# Patient Record
Sex: Female | Born: 1995 | Race: Black or African American | Hispanic: No | Marital: Single | Smoking: Never smoker
Health system: Southern US, Community
[De-identification: ages and names within clinical notes are randomized; demographics above are authoritative.]

## PROBLEM LIST (undated history)

## (undated) DIAGNOSIS — F32A Depression, unspecified: Secondary | ICD-10-CM

## (undated) DIAGNOSIS — G4730 Sleep apnea, unspecified: Secondary | ICD-10-CM

## (undated) DIAGNOSIS — F329 Major depressive disorder, single episode, unspecified: Secondary | ICD-10-CM

## (undated) DIAGNOSIS — R569 Unspecified convulsions: Secondary | ICD-10-CM

## (undated) DIAGNOSIS — F419 Anxiety disorder, unspecified: Secondary | ICD-10-CM

## (undated) HISTORY — PX: NO PAST SURGERIES: SHX2092

## (undated) HISTORY — PX: OTHER SURGICAL HISTORY: SHX169

---
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
## 2014-01-10 ENCOUNTER — Ambulatory Visit (INDEPENDENT_AMBULATORY_CARE_PROVIDER_SITE_OTHER): Payer: BC Managed Care – PPO | Admitting: Neurology

## 2014-01-10 ENCOUNTER — Encounter: Payer: Self-pay | Admitting: Neurology

## 2014-01-10 ENCOUNTER — Encounter (INDEPENDENT_AMBULATORY_CARE_PROVIDER_SITE_OTHER): Payer: Self-pay

## 2014-01-10 VITALS — BP 97/70 | HR 99 | Ht 65.0 in | Wt 135.0 lb

## 2014-01-10 DIAGNOSIS — R569 Unspecified convulsions: Secondary | ICD-10-CM | POA: Insufficient documentation

## 2014-01-10 MED ORDER — LEVETIRACETAM 500 MG PO TABS: 500.0000 mg | ORAL_TABLET | Freq: Two times a day (BID) | ORAL | Status: DC

## 2014-01-10 NOTE — Progress Notes (Signed)
PATIENT: Sharon [name] DOB: 1995-12-08  HISTORICAL  Sharon [name] is a 18 years old right-handed African American female, referred by her school health care Sharon [name] for evaluation of seizure    She reported a history of seizures since age 73, all episodes have similar presentation, sometime she proceeded by heart beating fast, mild left-sided headaches, followed by convulsion, lost of consciousness, sometimes she workup with tongue biting, initially she has episode couple times each 3 months, but in recent 2 weeks, she had an episode daily, the last episode was yesterday evening, she was sitting on the sofa watching TV, woke up confused, she had bumped her head into furniture. She was treated gabapentin 300 mg every night by her primary care physician,  She reported previous cardiac monitoring, for one month, no significant abnormality found,  REVIEW OF SYSTEMS: Full 14 system review of systems performed and notable only for seizure, slurred speech, headaches, chest pain  ALLERGIES: Allergies  Allergen Reactions  . Latex   . Wheat Bran   . Tape Rash    HOME MEDICATIONS: Current Outpatient Prescriptions on File Prior to Visit  Medication Sig Dispense Refill  . gabapentin (NEURONTIN) 300 MG capsule Take 300 mg by mouth at bedtime.      . triamcinolone cream (KENALOG) 0.1 % Apply 1 application topically 3 (three) times daily as needed (for eczema).       No current facility-administered medications on file prior to visit.    PAST MEDICAL HISTORY: Past Medical History  Diagnosis Date  . Anxiety   . Depression   . Seizure-like activity     PAST SURGICAL HISTORY: Past Surgical History  Procedure Laterality Date  . None      FAMILY HISTORY: Family History  Problem Relation Age of Onset  .       SOCIAL HISTORY:  History   Social History  . Marital Status: Single    Spouse Name: N/A    Number of Children: 0  . Years of Education: college    Occupational History  . STUDENT [employer]   Social History Main Topics  . Smoking status: Never Smoker   . Smokeless tobacco: Never Used  . Alcohol Use: No  . Drug Use: No  . Sexual Activity: Not on file   Other Topics Concern  . Not on file   Social History Narrative   Patient is going to college full time . [employer].    Patient is working full time.   Right handed.   Caffeine None     PHYSICAL EXAM   Filed Vitals:   01/10/14 1548  BP: 97/70  Pulse: 99  Height:  (1.651 m)  Weight: 135 lb (61.236 kg)    Not recorded    Body mass index is 22.47 kg/(m^2).   Generalized: In no acute distress  Neck: Supple, no carotid bruits   Cardiac: Regular rate rhythm  Pulmonary: Clear to auscultation bilaterally  Musculoskeletal: No deformity  Neurological examination  Mentation: Alert oriented to time, place, history taking, and causual conversation  Cranial nerve II-XII: Pupils were equal round reactive to light. Extraocular movements were full.  Visual field were full on confrontational test. Bilateral fundi were sharp.  Facial sensation and strength were normal. Hearing was intact to finger rubbing bilaterally. Uvula tongue midline.  Head turning and shoulder shrug and were normal and symmetric.Tongue protrusion into cheek strength was normal.  Motor: Normal tone, bulk and strength.  Sensory: Intact to  fine touch, pinprick, preserved vibratory sensation, and proprioception at toes.  Coordination: Normal finger to nose, heel-to-shin bilaterally there was no truncal ataxia  Gait: Rising up from seated position without assistance, normal stance, without trunk ataxia, moderate stride, good arm swing, smooth turning, able to perform tiptoe, and heel walking without difficulty.   Romberg signs: Negative  Deep tendon reflexes: Brachioradialis 2/2, biceps 2/2, triceps 2/2, patellar 2/2, Achilles 2/2, plantar responses were flexor bilaterally.   DIAGNOSTIC  DATA (LABS, IMAGING, TESTING) - I reviewed patient records, labs, notes, testing and imaging myself where available.   ASSESSMENT AND PLAN  Sharon [name] is a 18 y.o. female presenting with recurrent seizure, consistent with generalized tonic-clonic seizure  1. Complete evaluation with MRI of the brain with and without contrast, EEG 2. No driving until seizure free for 6 months, 3. Keppra 500 mg twice a day 4. Return to clinic in one to 2 months.     Levert Feinstein, M.D. Ph.D.  [facility] [address], [address] [place], [place] [address] [phone]

## 2014-01-17 ENCOUNTER — Other Ambulatory Visit: Payer: BC Managed Care – PPO

## 2014-01-21 ENCOUNTER — Other Ambulatory Visit: Payer: BC Managed Care – PPO | Admitting: Radiology

## 2014-02-11 ENCOUNTER — Ambulatory Visit: Payer: BC Managed Care – PPO | Admitting: Neurology

## 2014-02-26 ENCOUNTER — Encounter (HOSPITAL_COMMUNITY): Payer: Self-pay | Admitting: Emergency Medicine

## 2014-02-26 ENCOUNTER — Emergency Department (HOSPITAL_COMMUNITY)
Admission: EM | Admit: 2014-02-26 | Discharge: 2014-02-26 | Disposition: A | Discharge status: Home / Self Care | Payer: BC Managed Care – PPO | Attending: Emergency Medicine | Admitting: Emergency Medicine

## 2014-02-26 DIAGNOSIS — Z91040 Latex allergy status: Secondary | ICD-10-CM | POA: Diagnosis not present

## 2014-02-26 DIAGNOSIS — F329 Major depressive disorder, single episode, unspecified: Secondary | ICD-10-CM | POA: Insufficient documentation

## 2014-02-26 DIAGNOSIS — N390 Urinary tract infection, site not specified: Secondary | ICD-10-CM | POA: Insufficient documentation

## 2014-02-26 DIAGNOSIS — R569 Unspecified convulsions: Secondary | ICD-10-CM | POA: Diagnosis present

## 2014-02-26 DIAGNOSIS — Z3202 Encounter for pregnancy test, result negative: Secondary | ICD-10-CM | POA: Insufficient documentation

## 2014-02-26 DIAGNOSIS — F419 Anxiety disorder, unspecified: Secondary | ICD-10-CM | POA: Diagnosis not present

## 2014-02-26 DIAGNOSIS — G40909 Epilepsy, unspecified, not intractable, without status epilepticus: Secondary | ICD-10-CM | POA: Insufficient documentation

## 2014-02-26 DIAGNOSIS — Z79899 Other long term (current) drug therapy: Secondary | ICD-10-CM | POA: Diagnosis not present

## 2014-02-26 LAB — URINE MICROSCOPIC-ADD ON

## 2014-02-26 LAB — CBG MONITORING, ED: GLUCOSE-CAPILLARY: 91 mg/dL (ref 70–99)

## 2014-02-26 LAB — URINALYSIS, ROUTINE W REFLEX MICROSCOPIC
BILIRUBIN URINE: NEGATIVE
GLUCOSE, UA: NEGATIVE mg/dL
Ketones, ur: NEGATIVE mg/dL
Nitrite: POSITIVE — AB
Protein, ur: NEGATIVE mg/dL
Specific Gravity, Urine: 1.026 (ref 1.005–1.030)
UROBILINOGEN UA: 1 mg/dL (ref 0.0–1.0)
pH: 6 (ref 5.0–8.0)

## 2014-02-26 LAB — POC URINE PREG, ED: PREG TEST UR: NEGATIVE

## 2014-02-26 MED ORDER — CEFPODOXIME PROXETIL 100 MG PO TABS: 100.0000 mg | ORAL_TABLET | Freq: Two times a day (BID) | ORAL | Status: DC

## 2014-02-26 MED ORDER — LEVETIRACETAM IN NACL 1000 MG/100ML IV SOLN
1000.0000 mg | Freq: Once | INTRAVENOUS | Status: AC
  Administered 2014-02-26: 1000 mg via INTRAVENOUS
  Filled 2014-02-26: qty 100

## 2014-02-26 NOTE — ED Provider Notes (Signed)
CSN: [id number]     Arrival date & time 02/26/14  1048 History   First MD Initiated Contact with Patient 02/26/14 1057     Chief Complaint  Patient presents with  . 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 Per EMS she had no postictal phase. She was given 5 mg of IV Versed in route. She states she has had seizures between 5 and 10 years. She states she has an aura associated with seizures and remembers having an aura today. She has had no tongue biting, no bowel or bladder incontinence. She reports having recently improving URI symptoms as well as recent UTI which has not improved despite antibiotic treatment. She sleeps about 4 hours nightly. She reports being compliant with her Keppra which she has been on for one week but has not taken it today.  Patient is a 18 y.o. female presenting with seizures.  Seizures   Past Medical History  Diagnosis Date  . Anxiety   . Depression   . Seizure-like activity    Past Surgical History  Procedure Laterality Date  . None     Family History  Problem Relation Age of Onset  .      History  Substance Use Topics  . Smoking status: Never Smoker   . Smokeless tobacco: Never Used  . Alcohol Use: No   OB History    No data available     Review of Systems  Constitutional: Negative for fever, chills, diaphoresis, activity change, appetite change and fatigue.  HENT: Negative for congestion, facial swelling, rhinorrhea and sore throat.   Eyes: Negative for photophobia and discharge.  Respiratory: Negative for cough, chest tightness and shortness of breath.   Cardiovascular: Negative for chest pain, palpitations and leg swelling.  Gastrointestinal: Negative for nausea, vomiting, abdominal pain and diarrhea.  Endocrine: Negative for polydipsia and  polyuria.  Genitourinary: Negative for dysuria, frequency, difficulty urinating and pelvic pain.  Musculoskeletal: Negative for back pain, arthralgias, neck pain and neck stiffness.  Skin: Negative for color change and wound.  Allergic/Immunologic: Negative for immunocompromised state.  Neurological: Positive for seizures. Negative for facial asymmetry, weakness, numbness and headaches.  Hematological: Does not bruise/bleed easily.  Psychiatric/Behavioral: Negative for confusion and agitation.      Allergies  Latex; Wheat bran; and Tape  Home Medications   Prior to Admission medications   Medication Sig Start Date End Date Taking? Authorizing Provider  levETIRAcetam (KEPPRA) 500 MG tablet Take 1 tablet (500 mg total) by mouth 2 (two) times daily. 01/10/14  Yes Levert FeinsteinYijun Yan, MD  levonorgestrel-ethinyl estradiol (SEASONALE,INTROVALE,JOLESSA) 0.15-0.03 MG tablet Take 1 tablet by mouth daily.   Yes Historical Provider, MD  triamcinolone cream (KENALOG) 0.1 % Apply 1 application topically 3 (three) times daily as needed (for eczema).   Yes Historical Provider, MD  cefpodoxime (VANTIN) 100 MG tablet Take 1 tablet (100 mg total) by mouth 2 (two) times daily. 02/26/14   Toy CookeyMegan Docherty, MD  gabapentin (NEURONTIN) 300 MG capsule Take 300 mg by mouth at bedtime.    Historical Provider, MD   BP 110/73 mmHg  Pulse 84  Temp(Src) 98.2 F (36.8 C) (Oral)  Resp 21  SpO2 100%  LMP 02/07/2014 Physical Exam  Constitutional: She is oriented to person, place, and time. She appears well-developed and well-nourished. No distress.  HENT:  Head: Normocephalic and atraumatic.  Mouth/Throat:  No oropharyngeal exudate.  Eyes: Pupils are equal, round, and reactive to light.  Neck: Normal range of motion. Neck supple.  Cardiovascular: Normal rate, regular rhythm and normal heart sounds.  Exam reveals no gallop and no friction rub.   No murmur heard. Pulmonary/Chest: Effort normal and breath sounds normal. No  respiratory distress. She has no wheezes. She has no rales.  Abdominal: Soft. Bowel sounds are normal. She exhibits no distension and no mass. There is no tenderness. There is no rebound and no guarding.  Musculoskeletal: Normal range of motion. She exhibits no edema or tenderness.  Neurological: She is alert and oriented to person, place, and time. She has normal strength. She displays no tremor. No cranial nerve deficit or sensory deficit. She exhibits normal muscle tone. She displays a negative Romberg sign. Coordination normal. GCS eye subscore is 4. GCS verbal subscore is 5. GCS motor subscore is 6.  Skin: Skin is warm and dry.  Psychiatric: She has a normal mood and affect.    ED Course  Procedures (including critical care time) Labs Review Labs Reviewed  URINALYSIS, ROUTINE W REFLEX MICROSCOPIC - Abnormal; Notable for the following:    APPearance CLOUDY (*)    Hgb urine dipstick TRACE (*)    Nitrite POSITIVE (*)    Leukocytes, UA SMALL (*)    All other components within normal limits  URINE MICROSCOPIC-ADD ON - Abnormal; Notable for the following:    Bacteria, UA MANY (*)    All other components within normal limits  POC URINE PREG, ED  CBG MONITORING, ED    Imaging Review No results found.   EKG Interpretation   Date/Time:  Tuesday February 26 2014 10:55:50 EST Ventricular Rate:  93 PR Interval:  130 QRS Duration: 84 QT Interval:  342 QTC Calculation: 425 R Axis:   101 Text Interpretation:  Sinus rhythm Borderline right axis deviation No  prior for comparison Confirmed by DOCHERTY  MD, MEGAN ([id number]) on 02/26/2014  11:01:18 AM      MDM   Final diagnoses:  Seizures  UTI (lower urinary tract infection)    Pt is a 18 y.o. female with Pmhx as above who presents with one witnessed seizure. Per family. Per EMS patient had no postictal phase, she received 5 mg of IV Versed in route. On my exam she is awake alert and in no acute distress. She is no focal neuro  findings. She reports she saw Dr. Vanessa KickJan in September who placed her on Keppra which she started 1 week ago and also recommended an MRI and EEG which she has chosen to not do given cost ( of no she does have insurance). She reports having a recent URI which she feels is improving as well as recent UTI which she feels is not improving despite 3 days of Bactrim and 3 days of an unknown other antibiotic. She also reports she gets only about 4 hours of sleep at night. I will give an IV load of Keppra in the department and recheck UA. I have encouraged her to follow up with Dr. Terrace ArabiaYan even if she does not follow through with EEG and MRI.  UA appears infected. Will place on 1 week Vantin.        Toy CookeyMegan Docherty, MD 02/26/14 1630

## 2014-02-26 NOTE — ED Notes (Signed)
Pt unable to void 

## 2014-02-26 NOTE — ED Notes (Signed)
Bed: WA17 Expected date:  Expected time:  Means of arrival:  Comments: Ems seizure 

## 2014-02-26 NOTE — ED Notes (Signed)
CBG 91 performed in ED

## 2014-02-26 NOTE — ED Notes (Addendum)
Per EMS pt is [employer] Hx of seizures. Pt had 1 witnessed seizure per faculty. Per EMS pt had no postictal phase. Pt received total of 5 mg versed IVP. Pt is alert and oriented x4 at present. Per EMS CBG 107

## 2014-02-26 NOTE — Discharge Instructions (Signed)
Urinary Tract Infection °Urinary tract infections (UTIs) can develop anywhere along your urinary tract. Your urinary tract is your body's drainage system for removing wastes and extra water. Your urinary tract includes two kidneys, two ureters, a bladder, and a urethra. Your kidneys are a pair of bean-shaped organs. Each kidney is about the size of your fist. They are located below your ribs, one on each side of your spine. °CAUSES °Infections are caused by microbes, which are microscopic organisms, including fungi, viruses, and bacteria. These organisms are so small that they can only be seen through a microscope. Bacteria are the microbes that most commonly cause UTIs. °SYMPTOMS  °Symptoms of UTIs may vary by age and gender of the patient and by the location of the infection. Symptoms in young women typically include a frequent and intense urge to urinate and a painful, burning feeling in the bladder or urethra during urination. Older women and men are more likely to be tired, shaky, and weak and have muscle aches and abdominal pain. A fever may mean the infection is in your kidneys. Other symptoms of a kidney infection include pain in your back or sides below the ribs, nausea, and vomiting. °DIAGNOSIS °To diagnose a UTI, your caregiver will ask you about your symptoms. Your caregiver also will ask to provide a urine sample. The urine sample will be tested for bacteria and white blood cells. White blood cells are made by your body to help fight infection. °TREATMENT  °Typically, UTIs can be treated with medication. Because most UTIs are caused by a bacterial infection, they usually can be treated with the use of antibiotics. The choice of antibiotic and length of treatment depend on your symptoms and the type of bacteria causing your infection. °HOME CARE INSTRUCTIONS °· If you were prescribed antibiotics, take them exactly as your caregiver instructs you. Finish the medication even if you feel better after you  have only taken some of the medication. °· Drink enough water and fluids to keep your urine clear or pale yellow. °· Avoid caffeine, tea, and carbonated beverages. They tend to irritate your bladder. °· Empty your bladder often. Avoid holding urine for long periods of time. °· Empty your bladder before and after sexual intercourse. °· After a bowel movement, women should cleanse from front to back. Use each tissue only once. °SEEK MEDICAL CARE IF:  °· You have back pain. °· You develop a fever. °· Your symptoms do not begin to resolve within 3 days. °SEEK IMMEDIATE MEDICAL CARE IF:  °· You have severe back pain or lower abdominal pain. °· You develop chills. °· You have nausea or vomiting. °· You have continued burning or discomfort with urination. °MAKE SURE YOU:  °· Understand these instructions. °· Will watch your condition. °· Will get help right away if you are not doing well or get worse. °Document Released: 01/13/2005 Document Revised: 10/05/2011 Document Reviewed: 05/14/2011 °ExitCare® Patient Information ©2015 [employer]. This information is not intended to replace advice given to you by your health care provider. Make sure you discuss any questions you have with your health care provider. ° °Epilepsy °Epilepsy is a disorder in which a person has repeated seizures over time. A seizure is a release of abnormal electrical activity in the brain. Seizures can cause a change in attention, behavior, or the ability to remain awake and alert (altered mental status). Seizures often involve uncontrollable shaking (convulsions).  °Most people with epilepsy lead normal lives. However, people with epilepsy are at an   increased risk of falls, accidents, and injuries. Therefore, it is important to begin treatment right away. °CAUSES  °Epilepsy has many possible causes. Anything that disturbs the normal pattern of brain cell activity can lead to seizures. This may include:  °· Head injury. °· Birth trauma. °· High fever  as a child. °· Stroke. °· Bleeding into or around the brain. °· Certain drugs. °· Prolonged low oxygen, such as what occurs after CPR efforts. °· Abnormal brain development. °· Certain illnesses, such as meningitis, encephalitis (brain infection), malaria, and other infections. °· An imbalance of nerve signaling chemicals (neurotransmitters).   °SIGNS AND SYMPTOMS  °The symptoms of a seizure can vary greatly from one person to another. Right before a seizure, you may have a warning (aura) that a seizure is about to occur. An aura may include the following symptoms: °· Fear or anxiety. °· Nausea. °· Feeling like the room is spinning (vertigo). °· Vision changes, such as seeing flashing lights or spots. °Common symptoms during a seizure include: °· Abnormal sensations, such as an abnormal smell or a bitter taste in the mouth.   °· Sudden, general body stiffness.   °· Convulsions that involve rhythmic jerking of the face, arm, or leg on one or both sides.   °· Sudden change in consciousness.   °¨ Appearing to be awake but not responding.   °¨ Appearing to be asleep but cannot be awakened.   °· Grimacing, chewing, lip smacking, drooling, tongue biting, or loss of bowel or bladder control. °After a seizure, you may feel sleepy for a while.  °DIAGNOSIS  °Your health care provider will ask about your symptoms and take a medical history. Descriptions from any witnesses to your seizures will be very helpful in the diagnosis. A physical exam, including a detailed neurological exam, is necessary. Various tests may be done, such as:  °· An electroencephalogram (EEG). This is a painless test of your brain waves. In this test, a diagram is created of your brain waves. These diagrams can be interpreted by a specialist. °· An MRI of the brain.   °· A CT scan of the brain.   °· A spinal tap (lumbar puncture, LP). °· Blood tests to check for signs of infection or abnormal blood chemistry. °TREATMENT  °There is no cure for epilepsy,  but it is generally treatable. Once epilepsy is diagnosed, it is important to begin treatment as soon as possible. For most people with epilepsy, seizures can be controlled with medicines. The following may also be used: °· A pacemaker for the brain (vagus nerve stimulator) can be used for people with seizures that are not well controlled by medicine. °· Surgery on the brain. °For some people, epilepsy eventually goes away. °HOME CARE INSTRUCTIONS  °· Follow your health care provider's recommendations on driving and safety in normal activities. °· Get enough rest. Lack of sleep can cause seizures. °· Only take over-the-counter or prescription medicines as directed by your health care provider. Take any prescribed medicine exactly as directed. °· Avoid any known triggers of your seizures. °· Keep a seizure diary. Record what you recall about any seizure, especially any possible trigger.   °· Make sure the people you live and work with know that you are prone to seizures. They should receive instructions on how to help you. In general, a witness to a seizure should:   °¨ Cushion your head and body.   °¨ Turn you on your side.   °¨ Avoid unnecessarily restraining you.   °¨ Not place anything inside your mouth.   °¨ Call for emergency   medical help if there is any question about what has occurred.   °· Follow up with your health care provider as directed. You may need regular blood tests to monitor the levels of your medicine.   °SEEK MEDICAL CARE IF:  °· You develop signs of infection or other illness. This might increase the risk of a seizure.   °· You seem to be having more frequent seizures.   °· Your seizure pattern is changing.   °SEEK IMMEDIATE MEDICAL CARE IF:  °· You have a seizure that does not stop after a few moments.   °· You have a seizure that causes any difficulty in breathing.   °· You have a seizure that results in a very severe headache.   °· You have a seizure that leaves you with the inability to  speak or use a part of your body.   °Document Released: 04/05/2005 Document Revised: 01/24/2013 Document Reviewed: 11/15/2012 °ExitCare® Patient Information ©2015 [employer]. This information is not intended to replace advice given to you by your health care provider. Make sure you discuss any questions you have with your health care provider. ° °

## 2014-02-28 ENCOUNTER — Encounter (HOSPITAL_COMMUNITY): Payer: Self-pay | Admitting: Emergency Medicine

## 2014-02-28 ENCOUNTER — Emergency Department (HOSPITAL_COMMUNITY): Payer: BC Managed Care – PPO

## 2014-02-28 ENCOUNTER — Observation Stay (HOSPITAL_COMMUNITY): Payer: BC Managed Care – PPO

## 2014-02-28 ENCOUNTER — Observation Stay (HOSPITAL_COMMUNITY)
Admission: EM | Admit: 2014-02-28 | Discharge: 2014-03-01 | Disposition: A | Discharge status: Home / Self Care | Payer: BC Managed Care – PPO | Attending: Internal Medicine | Admitting: Internal Medicine

## 2014-02-28 DIAGNOSIS — Z91040 Latex allergy status: Secondary | ICD-10-CM | POA: Insufficient documentation

## 2014-02-28 DIAGNOSIS — R569 Unspecified convulsions: Secondary | ICD-10-CM

## 2014-02-28 DIAGNOSIS — F419 Anxiety disorder, unspecified: Secondary | ICD-10-CM | POA: Diagnosis present

## 2014-02-28 DIAGNOSIS — G40909 Epilepsy, unspecified, not intractable, without status epilepticus: Principal | ICD-10-CM | POA: Insufficient documentation

## 2014-02-28 DIAGNOSIS — G4730 Sleep apnea, unspecified: Secondary | ICD-10-CM | POA: Diagnosis not present

## 2014-02-28 DIAGNOSIS — Z91018 Allergy to other foods: Secondary | ICD-10-CM | POA: Insufficient documentation

## 2014-02-28 DIAGNOSIS — F329 Major depressive disorder, single episode, unspecified: Secondary | ICD-10-CM | POA: Diagnosis not present

## 2014-02-28 DIAGNOSIS — Z79899 Other long term (current) drug therapy: Secondary | ICD-10-CM | POA: Diagnosis not present

## 2014-02-28 DIAGNOSIS — F32A Depression, unspecified: Secondary | ICD-10-CM | POA: Diagnosis present

## 2014-02-28 HISTORY — DX: Sleep apnea, unspecified: G47.30

## 2014-02-28 LAB — BASIC METABOLIC PANEL
Anion gap: 12 (ref 5–15)
BUN: 11 mg/dL (ref 6–23)
CHLORIDE: 102 meq/L (ref 96–112)
CO2: 22 mEq/L (ref 19–32)
Calcium: 9.3 mg/dL (ref 8.4–10.5)
Creatinine, Ser: 0.72 mg/dL (ref 0.50–1.10)
GFR calc Af Amer: >90 mL/min (ref >90)
GFR calc non Af Amer: >90 mL/min (ref >90)
Glucose, Bld: 101 mg/dL — ABNORMAL HIGH (ref 70–99)
Potassium: 4 mEq/L (ref 3.7–5.3)
SODIUM: 136 meq/L — AB (ref 137–147)

## 2014-02-28 LAB — RAPID URINE DRUG SCREEN, HOSP PERFORMED
Amphetamines: NOT DETECTED
Barbiturates: NOT DETECTED
Benzodiazepines: POSITIVE — AB
COCAINE: NOT DETECTED
OPIATES: NOT DETECTED
TETRAHYDROCANNABINOL: NOT DETECTED

## 2014-02-28 LAB — CBC
HCT: 38.3 % (ref 36.0–46.0)
Hemoglobin: 12 g/dL (ref 12.0–15.0)
MCH: 25.2 pg — ABNORMAL LOW (ref 26.0–34.0)
MCHC: 31.3 g/dL (ref 30.0–36.0)
MCV: 80.5 fL (ref 78.0–100.0)
PLATELETS: 273 10*3/uL (ref 150–400)
RBC: 4.76 MIL/uL (ref 3.87–5.11)
RDW: 13.5 % (ref 11.5–15.5)
WBC: 6.6 10*3/uL (ref 4.0–10.5)

## 2014-02-28 LAB — CBG MONITORING, ED
Glucose-Capillary: 78 mg/dL (ref 70–99)
Glucose-Capillary: 86 mg/dL (ref 70–99)

## 2014-02-28 LAB — PREGNANCY, URINE: Preg Test, Ur: NEGATIVE

## 2014-02-28 MED ORDER — LEVETIRACETAM IN NACL 1000 MG/100ML IV SOLN
1000.0000 mg | Freq: Once | INTRAVENOUS | Status: AC
  Administered 2014-02-28: 1000 mg via INTRAVENOUS
  Filled 2014-02-28: qty 100

## 2014-02-28 MED ORDER — HEPARIN SODIUM (PORCINE) 5000 UNIT/ML IJ SOLN
5000.0000 [IU] | Freq: Three times a day (TID) | INTRAMUSCULAR | Status: DC
  Administered 2014-02-28 – 2014-03-01 (×2): 5000 [IU] via SUBCUTANEOUS
  Filled 2014-02-28 (×5): qty 1

## 2014-02-28 MED ORDER — LEVETIRACETAM 750 MG PO TABS
750.0000 mg | ORAL_TABLET | Freq: Two times a day (BID) | ORAL | Status: DC
  Administered 2014-02-28 – 2014-03-01 (×2): 750 mg via ORAL
  Filled 2014-02-28 (×3): qty 1

## 2014-02-28 MED ORDER — DEXTROSE 5 % IV SOLN
1.0000 g | INTRAVENOUS | Status: DC
  Administered 2014-02-28: 1 g via INTRAVENOUS
  Filled 2014-02-28 (×2): qty 10

## 2014-02-28 MED ORDER — GADOBENATE DIMEGLUMINE 529 MG/ML IV SOLN
12.0000 mL | Freq: Once | INTRAVENOUS | Status: AC | PRN
  Administered 2014-02-28: 12 mL via INTRAVENOUS

## 2014-02-28 MED ORDER — SODIUM CHLORIDE 0.9 % IJ SOLN
3.0000 mL | Freq: Two times a day (BID) | INTRAMUSCULAR | Status: DC
  Administered 2014-02-28 – 2014-03-01 (×2): 3 mL via INTRAVENOUS

## 2014-02-28 MED ORDER — LEVONORGEST-ETH ESTRAD 91-DAY 0.15-0.03 MG PO TABS: 1.0000 | ORAL_TABLET | Freq: Every day | ORAL | Status: DC

## 2014-02-28 NOTE — ED Provider Notes (Signed)
CSN: [id number]     Arrival date & time 02/28/14  1431 History   First MD Initiated Contact with Patient 02/28/14 1512     Chief Complaint  Patient presents with  . Seizures     HPI  Reason for evaluation possible seizure. States she had seizures since she was in high school. States she was originally diagnosed as "syncope". Been on Neurontin for several years. Seen by you for neurology several weeks ago. Change from Neurontin, to Keppra 500 twice a day. Had 3 episodes today where she feels she may have had seizure. First she was at home alone. Awakened feeling "disoriented and confused. Went to healthcare Center was referred here. Had another episode en route with paramedics. Brief postictal period. However, was able to use her phone before arrival to the hospital.  Has never had evaluation CNS imaging, MRI, or EEG. No history of CNS infections or injuries.  Past Medical History  Diagnosis Date  . Anxiety   . Depression   . Seizure-like activity   . Sleep apnea     as a child and grew out of it   Past Surgical History  Procedure Laterality Date  . None    . No past surgeries     Family History  Problem Relation Age of Onset  .      History  Substance Use Topics  . Smoking status: Never Smoker   . Smokeless tobacco: Never Used  . Alcohol Use: No   OB History    No data available     Review of Systems  Constitutional: Negative for fever, chills, diaphoresis, appetite change and fatigue.  HENT: Negative for mouth sores, sore throat and trouble swallowing.   Eyes: Negative for visual disturbance.  Respiratory: Positive for cough. Negative for chest tightness, shortness of breath and wheezing.   Cardiovascular: Negative for chest pain.  Gastrointestinal: Negative for nausea, vomiting, abdominal pain, diarrhea and abdominal distention.  Endocrine: Negative for polydipsia, polyphagia and polyuria.  Genitourinary: Negative for dysuria, frequency and hematuria.   Musculoskeletal: Negative for gait problem.  Skin: Negative for color change, pallor and rash.  Neurological: Positive for seizures. Negative for dizziness, syncope, light-headedness and headaches.  Hematological: Does not bruise/bleed easily.  Psychiatric/Behavioral: Negative for behavioral problems and confusion.      Allergies  Latex; Wheat bran; Nickel; and Tape  Home Medications   Prior to Admission medications   Medication Sig Start Date End Date Taking? Authorizing Provider  guaiFENesin (ROBITUSSIN) 100 MG/5ML liquid Take 400 mg by mouth 3 (three) times daily as needed for cough (cough).   Yes Historical Provider, MD  levETIRAcetam (KEPPRA) 500 MG tablet Take 1 tablet (500 mg total) by mouth 2 (two) times daily. 01/10/14  Yes Levert FeinsteinYijun Yan, MD  Pseudoephedrine-APAP-DM (DAYQUIL PO) Take 2 capsules by mouth daily as needed (cough).   Yes Historical Provider, MD  triamcinolone cream (KENALOG) 0.1 % Apply 1 application topically 3 (three) times daily as needed (for eczema).   Yes Historical Provider, MD  cefpodoxime (VANTIN) 100 MG tablet Take 1 tablet (100 mg total) by mouth 2 (two) times daily. 02/26/14   Toy CookeyMegan Docherty, MD  gabapentin (NEURONTIN) 300 MG capsule Take 300 mg by mouth at bedtime.    Historical Provider, MD  levonorgestrel-ethinyl estradiol (SEASONALE,INTROVALE,JOLESSA) 0.15-0.03 MG tablet Take 1 tablet by mouth daily.    Historical Provider, MD   BP 105/65 mmHg  Pulse 86  Temp(Src) 99.2 F (37.3 C) (Oral)  Resp 14  SpO2 100%  LMP 02/07/2014 Physical Exam  Constitutional: She is oriented to person, place, and time. She appears well-developed and well-nourished. No distress.  HENT:  Head: Normocephalic.  Eyes: Conjunctivae are normal. Pupils are equal, round, and reactive to light. No scleral icterus.  Neck: Normal range of motion. Neck supple. No thyromegaly present.  Cardiovascular: Normal rate and regular rhythm.  Exam reveals no gallop and no friction rub.    No murmur heard. Pulmonary/Chest: Effort normal and breath sounds normal. No respiratory distress. She has no wheezes. She has no rales.  Abdominal: Soft. Bowel sounds are normal. She exhibits no distension. There is no tenderness. There is no rebound.  Musculoskeletal: Normal range of motion.  Neurological: She is alert and oriented to person, place, and time.  Skin: Skin is warm and dry. No rash noted.  Psychiatric: She has a normal mood and affect. Her behavior is normal.    ED Course  Procedures (including critical care time) Labs Review Labs Reviewed  BASIC METABOLIC PANEL - Abnormal; Notable for the following:    Sodium 136 (*)    Glucose, Bld 101 (*)    All other components within normal limits  URINE RAPID DRUG SCREEN (HOSP PERFORMED) - Abnormal; Notable for the following:    Benzodiazepines POSITIVE (*)    All other components within normal limits  PREGNANCY, URINE  CBG MONITORING, ED  CBG MONITORING, ED    Imaging Review Dg Chest 2 View  02/28/2014   CLINICAL DATA:  Shortness of breath and cough. Left lower chest pain anteriorly. Nonsmoker. The patient had 3 seizures after imaging.  EXAM: CHEST  2 VIEW  COMPARISON:  None.  FINDINGS: The heart size and mediastinal contours are within normal limits. Both lungs are clear. The visualized skeletal structures are unremarkable.  IMPRESSION: No active cardiopulmonary disease.   Electronically Signed   By: Rosalie GumsBeth  Brown M.D.   On: 02/28/2014 16:50     EKG Interpretation None      MDM   Final diagnoses:  Seizure   Patient had a reported episode in radiology shaking. See her immediately after in her room. She is confused. She thinks she is at school. She is talking about being late for class. Although not witnessed by myself, very likely is additional seizure. Discussed with neurology who is seen the patient in the emergency room. Recommending admission for additional testing in the teeth epileptics. Discussed with Dr. Nedra HaiLee,   [facility]. He is here admitting the patient.    Rolland PorterMark Eluzer Howdeshell, MD 02/28/14 [phone]

## 2014-02-28 NOTE — ED Notes (Signed)
Pt given 8 ounces of orange juice.

## 2014-02-28 NOTE — ED Notes (Signed)
Pt states chest pain "not as bad; not a throbbing but aching, dull pain."

## 2014-02-28 NOTE — ED Notes (Signed)
cbg 78 

## 2014-02-28 NOTE — ED Notes (Addendum)
Radiology reports tree episodes lasting 20-30 seconds each of seizure like activity. MD Fayrene FearingJames at bedside.

## 2014-02-28 NOTE — ED Notes (Signed)
Bed: WA07 Expected date:  Expected time:  Means of arrival:  Comments: seizure

## 2014-02-28 NOTE — Consult Note (Signed)
Consult Reason for Consult:multiple seizure episodes Referring Physician: Dr Ree KidaJames [facility]  CC: seizures  HPI: Sharon [name] is an 18 y.o. female with history of prior seizures presenting for multiple events in the past 24hours. Reports having 4 events prior to ED arrival. Had episode with EMS noted to last around 1 minute, per EMS no post ictal confusion. In radiology noted to have 3 more events lasting 20-30seconds each. Confused afterwards, not oriented to name or date but able to text on cell phone. Recently evaluated in the ED on 02/26/2014 for seizure episode, was loaded with keppra at that time. Found to have likely UTI and given prescription for cefpodoxime though she reports never taking it. Reports recurrent UTIs in the past 1-2 weeks for which she has been on 2 prior antibiotics (unsure of names) without resolution of symptoms.  She was loaded with 1000mg  Keppra IV in the ED.   [facility] Records reviewed from last visit 12/2013 at which time she was started on keppra 500mg  BID and instructed to get a MRI and EEG, these have not been completed. She did not start taking the keppra until 1 week ago. Missed dose this morning. Per records she notes seizures started at age 18 and appear GTC in nature. They are proceeded by palpitations, left-sided headache. Reports averaging around 4hrs of sleep a night.   Past Medical History  Diagnosis Date  . Anxiety   . Depression   . Seizure-like activity     Past Surgical History  Procedure Laterality Date  . None      Family History  Problem Relation Age of Onset  .      No family history of seizures  Social History:  reports that she has never smoked. She has never used smokeless tobacco. She reports that she does not drink alcohol or use illicit drugs.  Allergies  Allergen Reactions  . Latex Hives  . Wheat Bran Hives  . Nickel Rash  . Tape Rash   Medications: Prior to Admission:  (Not in a hospital  admission)   ROS: Out of a complete 14 system review, the patient complains of only the following symptoms, and all other reviewed systems are negative.  Physical Examination: Blood pressure 105/65, pulse 86, temperature 99.2 F (37.3 C), temperature source Oral, resp. rate 14, last menstrual period 02/07/2014, SpO2 100 %.  Neurologic Examination Mental Status: Alert, oriented to name, hospital, states date is "Mar 09, 2013". Reports president as "I heard Obama won the election". Can tell me her address, college name and major. Able to follow one and two step commands.  Cranial Nerves: II: funduscopic exam wnl bilaterally, visual fields grossly normal, pupils equal, round, reactive to light and accommodation III,IV, VI: ptosis not present, extra-ocular motions intact bilaterally V,VII: smile symmetric, facial light touch sensation normal bilaterally VIII: hearing normal bilaterally IX,X: gag reflex present XI: trapezius strength/neck flexion strength normal bilaterally XII: tongue strength normal  Motor: Right : Upper extremity    Left:     Upper extremity 5/5 deltoid       5/5 deltoid 5/5wrist flexion     5/5 wrist flexion 5/5 wrist extension     5/5 wrist extension 5/5 hand grip      5/5 hand grip  Lower extremity     Lower extremity 5/5 hip flexor      5/5 hip flexor 5/5 quadricep      5/5 quadriceps  5/5 hamstrings  5/5 hamstrings 5/5 plantar flexion       5/5 plantar flexion 5/5 plantar extension     5/5 plantar extension Tone and bulk:normal tone throughout; no atrophy noted Sensory:  light touch intact throughout, bilaterally Deep Tendon Reflexes: 2+ and symmetric throughout Plantars: Right: downgoing   Left: downgoing Cerebellar: normal finger-to-nose, normal rapid alternating movements and normal heel-to-shin test Gait: deferred due to multiple leads on in the ED  Laboratory Studies:   Basic Metabolic Panel:  Recent Labs Lab 02/28/14 1545  NA 136*  K 4.0   CL 102  CO2 22  GLUCOSE 101*  BUN 11  CREATININE 0.72  CALCIUM 9.3    Liver Function Tests: No results for input(s): AST, ALT, ALKPHOS, BILITOT, PROT, ALBUMIN in the last 168 hours. No results for input(s): LIPASE, AMYLASE in the last 168 hours. No results for input(s): AMMONIA in the last 168 hours.  CBC: No results for input(s): WBC, NEUTROABS, HGB, HCT, MCV, PLT in the last 168 hours.  Cardiac Enzymes: No results for input(s): CKTOTAL, CKMB, CKMBINDEX, TROPONINI in the last 168 hours.  BNP: Invalid input(s): POCBNP  CBG:  Recent Labs Lab 02/26/14 1057 02/28/14 1452 02/28/14 1654  GLUCAP 91 78 86    Microbiology: No results found for this or any previous visit.  Coagulation Studies: No results for input(s): LABPROT, INR in the last 72 hours.  Urinalysis:  Recent Labs Lab 02/26/14 1158  COLORURINE YELLOW  LABSPEC 1.026  PHURINE 6.0  GLUCOSEU NEGATIVE  HGBUR TRACE*  BILIRUBINUR NEGATIVE  KETONESUR NEGATIVE  PROTEINUR NEGATIVE  UROBILINOGEN 1.0  NITRITE POSITIVE*  LEUKOCYTESUR SMALL*    Lipid Panel:  No results found for: CHOL, TRIG, HDL, CHOLHDL, VLDL, LDLCALC  HgbA1C: No results found for: HGBA1C  Urine Drug Screen:  No results found for: LABOPIA, COCAINSCRNUR, LABBENZ, AMPHETMU, THCU, LABBARB  Alcohol Level: No results for input(s): ETH in the last 168 hours.  Other results:  Imaging: Dg Chest 2 View  02/28/2014   CLINICAL DATA:  Shortness of breath and cough. Left lower chest pain anteriorly. Nonsmoker. The patient had 3 seizures after imaging.  EXAM: CHEST  2 VIEW  COMPARISON:  None.  FINDINGS: The heart size and mediastinal contours are within normal limits. Both lungs are clear. The visualized skeletal structures are unremarkable.  IMPRESSION: No active cardiopulmonary disease.   Electronically Signed   By: Rosalie GumsBeth  Brown M.D.   On: 02/28/2014 16:50     Assessment/Plan:  1)seizures 2)UTI 3)Anxiety/depression  Ms Sharon [name] is a 18y/o  woman presenting with recurrent seizures. She appears to have a recurrent UTI which may be lowering her seizure threshold. Currently mildly disoriented, suspect that this is likely post-ictal confusion. Low suspicion for sub-clinical seizures as she is alert and following commands. EMS note of quick recovery/lack of post-ictal state raises question of possible non-epileptic event. As she has had multiple events in the past 12 hours will admit to complete the seizure workup.   -increase keppra to 750mg  BID -MRI brain with and without contrast -EEG -seizure precautions -ativan PRN for breakthrough seizures -no driving until follow up with outpatient neurologist Dr Vincente PoliYan  Sharon Dike, DO [facility] [phone]  If 7pm- 7am, please page neurology on call as listed in AMION. 02/28/2014, 5:37 PM

## 2014-02-28 NOTE — ED Notes (Addendum)
Upon entering room pt texting on cell phone. Pt does not appear drowsy. Pt alert and oriented to place and situation but not to time reports 03/09/2013 and states her name is Sharon [name].

## 2014-02-28 NOTE — Progress Notes (Signed)
  CARE MANAGEMENT ED NOTE 02/28/2014  Patient:  Sharon CarolinaMERCER,Nayelie   Account Number:  [id number]  Date Initiated:  02/28/2014  Documentation initiated by:  Radford PaxFERRERO,Toron Bowring  Subjective/Objective Assessment:   Patient presents to Ed with seizures     Subjective/Objective Assessment Detail:   Patient with history of anxiety, depression, sleep apnea. Patient noncompliant with medications.  PCP listed as Dr. Andi DevonKimberly Shelton.  Saw neurologist Dr. Terrace ArabiaYan on 09/24 and started on Keppra     Action/Plan:   Action/Plan Detail:   Anticipated DC Date:       Status Recommendation to Physician:   Result of Recommendation:    Other ED Services  Consult Working Plan    DC Planning Services  CM consult  Other    Choice offered to / List presented to:            Status of service:  Completed, signed off  ED Comments:   ED Comments Detail:  EDCM spoke to patient at bedside.  Patient reports she has concerns aout her hospital bill.  Patient listed as having [employer] insurance.  Patient stated, "But, oh wait, no I never get a bill with you."  EDCM inofrmed patient that if she gets a bill in the mail, and she is unable to pay it to call the phone number on the bill for assistance.  Patient verbalized understanding.  No further EDCM needs at this time.

## 2014-02-28 NOTE — ED Notes (Addendum)
Per EMS pt complaint of seizures. Pt reports four seizures today. One seizure with EMS lasting one minute. EMS reports pt NOT post ictal and uses phone to call mom after seizure activity. Pt takes Keppra twice a day but has not taken today or eaten today. Pt diagnosed with semi productive cough and cold within past month. Pt reports left side chest pain with cough.

## 2014-02-28 NOTE — ED Notes (Signed)
Pt not verifying name and DOB for cbg, but pt remembers having one done earlier

## 2014-02-28 NOTE — H&P (Signed)
[facility] History and Physical  Sharon [name] ZOX:[id number] DOB: 1995/04/22    PCP:   Alva GarnetSHELTON,Starr R., MD   Chief Complaint: several seizures today.  HPI: Sharon [name] is an 18 y.o. female with hx of seizure since age 18, previously started on Neurontin, but she didn't take them as she thought it increased her seizure frequency, started to have increase seizure activities.  She was seen in the ER and was loaded with Keppra, but she continue to have seizures.  She had 4 episodes of GTC seizure with post ictal periods today.  She also was found to have UTI, given Vantin, but she hadn't taken it.  She was seen by neurology today in the ER.  Her ER work up included a normal BS, Cr, and K.  Hospitalist was asked to admit her for observation.  She denied alcohol or drug use.  Rewiew of Systems:  Constitutional: Negative for malaise, fever and chills. No significant weight loss or weight gain Eyes: Negative for eye pain, redness and discharge, diplopia, visual changes, or flashes of light. ENMT: Negative for ear pain, hoarseness, nasal congestion, sinus pressure and sore throat. No headaches; tinnitus, drooling, or problem swallowing. Cardiovascular: Negative for chest pain, palpitations, diaphoresis, dyspnea and peripheral edema. ; No orthopnea, PND Respiratory: Negative for cough, hemoptysis, wheezing and stridor. No pleuritic chestpain. Gastrointestinal: Negative for nausea, vomiting, diarrhea, constipation, abdominal pain, melena, blood in stool, hematemesis, jaundice and rectal bleeding.    Genitourinary: Negative for frequency, dysuria, incontinence,flank pain and hematuria; Musculoskeletal: Negative for back pain and neck pain. Negative for swelling and trauma.;  Skin: . Negative for pruritus, rash, abrasions, bruising and skin lesion.; ulcerations Neuro: Negative for headache, lightheadedness and neck stiffness. Negative for weakness, altered level of consciousness , altered  mental status, extremity weakness, burning feet, involuntary movement, seizure and syncope.  Psych: negative for anxiety, depression, insomnia, tearfulness, panic attacks, hallucinations, paranoia, suicidal or homicidal ideation    Past Medical History  Diagnosis Date  . Anxiety   . Depression   . Seizure-like activity   . Sleep apnea     as a child and grew out of it    Past Surgical History  Procedure Laterality Date  . None    . No past surgeries      Medications:  HOME MEDS: Prior to Admission medications   Medication Sig Start Date End Date Taking? Authorizing Provider  guaiFENesin (ROBITUSSIN) 100 MG/5ML liquid Take 400 mg by mouth 3 (three) times daily as needed for cough (cough).   Yes Historical Provider, MD  levETIRAcetam (KEPPRA) 500 MG tablet Take 1 tablet (500 mg total) by mouth 2 (two) times daily. 01/10/14  Yes Levert FeinsteinYijun Yan, MD  Pseudoephedrine-APAP-DM (DAYQUIL PO) Take 2 capsules by mouth daily as needed (cough).   Yes Historical Provider, MD  triamcinolone cream (KENALOG) 0.1 % Apply 1 application topically 3 (three) times daily as needed (for eczema).   Yes Historical Provider, MD  cefpodoxime (VANTIN) 100 MG tablet Take 1 tablet (100 mg total) by mouth 2 (two) times daily. 02/26/14   Toy CookeyMegan Docherty, MD  gabapentin (NEURONTIN) 300 MG capsule Take 300 mg by mouth at bedtime.    Historical Provider, MD  levonorgestrel-ethinyl estradiol (SEASONALE,INTROVALE,JOLESSA) 0.15-0.03 MG tablet Take 1 tablet by mouth daily.    Historical Provider, MD     Allergies:  Allergies  Allergen Reactions  . Latex Hives  . Wheat Bran Hives  . Nickel Rash  . Tape Rash    Social History:  reports that she has never smoked. She has never used smokeless tobacco. She reports that she does not drink alcohol or use illicit drugs.  Family History: Family History  Problem Relation Age of Onset  .        Physical Exam: Filed Vitals:   02/28/14 1437 02/28/14 1544 02/28/14 1644  02/28/14 1659  BP: 107/63 102/65 104/58 105/65  Pulse: 85 83 88 86  Temp: 99.2 F (37.3 C)     TempSrc: Oral     Resp: 13 16  14   SpO2: 98% 100% 100% 100%   Blood pressure 105/65, pulse 86, temperature 99.2 F (37.3 C), temperature source Oral, resp. rate 14, last menstrual period 02/07/2014, SpO2 100 %.  GEN:  Pleasant patient lying in the stretcher in no acute distress; cooperative with exam. PSYCH:  alert and oriented x4; does not appear anxious or depressed; affect is appropriate. HEENT: Mucous membranes pink and anicteric; PERRLA; EOM intact; no cervical lymphadenopathy nor thyromegaly or carotid bruit; no JVD; There were no stridor. Neck is very supple. Breasts:: Not examined CHEST WALL: No tenderness CHEST: Normal respiration, clear to auscultation bilaterally.  HEART: Regular rate and rhythm.  There are no murmur, rub, or gallops.   BACK: No kyphosis or scoliosis; no CVA tenderness ABDOMEN: soft and non-tender; no masses, no organomegaly, normal abdominal bowel sounds; no pannus; no intertriginous candida. There is no rebound and no distention. Rectal Exam: Not done EXTREMITIES: No bone or joint deformity; age-appropriate arthropathy of the hands and knees; no edema; no ulcerations.  There is no calf tenderness. Genitalia: not examined PULSES: 2+ and symmetric SKIN: Normal hydration no rash or ulceration CNS: Cranial nerves 2-12 grossly intact no focal lateralizing neurologic deficit.  Speech is fluent; uvula elevated with phonation, facial symmetry and tongue midline. DTR are normal bilaterally, cerebella exam is intact, barbinski is negative and strengths are equaled bilaterally.  No sensory loss.   Labs on Admission:  Basic Metabolic Panel:  Recent Labs Lab 02/28/14 1545  NA 136*  K 4.0  CL 102  CO2 22  GLUCOSE 101*  BUN 11  CREATININE 0.72  CALCIUM 9.3   CBG:  Recent Labs Lab 02/26/14 1057 02/28/14 1452 02/28/14 1654  GLUCAP 91 78 86     Radiological  Exams on Admission: Dg Chest 2 View  02/28/2014   CLINICAL DATA:  Shortness of breath and cough. Left lower chest pain anteriorly. Nonsmoker. The patient had 3 seizures after imaging.  EXAM: CHEST  2 VIEW  COMPARISON:  None.  FINDINGS: The heart size and mediastinal contours are within normal limits. Both lungs are clear. The visualized skeletal structures are unremarkable.  IMPRESSION: No active cardiopulmonary disease.   Electronically Signed   By: Rosalie GumsBeth  Brown M.D.   On: 02/28/2014 16:50    Assessment/Plan Present on Admission:  . Depression . Anxiety Breakthrough seizures.  PLAN:  Will admit her to telemetry.  Neuro has been consulted and made recommendation.  Will increase Keppra to 750mg  BID.  Obtain MRI brain and EEG.  She is otherwise stable.  Tx UTI with IV Rocephin.  She is stable, full code, and will be admitted to hospitalist service.   Other plans as per orders.  Code Status: FULL Unk LightningODE.   Jayleene Glaeser, MD. [facility] Pager [phone] 7pm to 7am.  02/28/2014, 7:28 PM

## 2014-03-01 ENCOUNTER — Other Ambulatory Visit (HOSPITAL_COMMUNITY): Payer: BC Managed Care – PPO

## 2014-03-01 MED ORDER — LEVETIRACETAM 750 MG PO TABS: 750.0000 mg | ORAL_TABLET | Freq: Two times a day (BID) | ORAL | Status: DC

## 2014-03-01 MED ORDER — CIPROFLOXACIN HCL 500 MG PO TABS: 500.0000 mg | ORAL_TABLET | Freq: Two times a day (BID) | ORAL | Status: DC

## 2014-03-01 MED ORDER — LEVETIRACETAM 500 MG PO TABS: 750.0000 mg | ORAL_TABLET | Freq: Two times a day (BID) | ORAL | Status: DC

## 2014-03-01 NOTE — Progress Notes (Signed)
UR completed 

## 2014-03-01 NOTE — Progress Notes (Signed)
Outpatient EEG appointment made for pt on 03/04/2014 @ 0845 prior to [facility] 1115.

## 2014-03-01 NOTE — Progress Notes (Addendum)
Spoke back to [place] from EEG to inform that pt will not be able to have procedure done today due to schedule and new patients that have populated that take higher priority, will be able to complete the test on Mon. MD notified.

## 2014-03-01 NOTE — Progress Notes (Signed)
[facility] and spoke with a technician in regards to pts EEG. Was informed by Tresa EndoKelly that technician would be over to do procedure around 3pm today. Will follow-up with [facility] at that time.

## 2014-03-01 NOTE — Progress Notes (Signed)
Subjective: No further seizures.  No adverse effects on increased dose of Keppra  Objective: Current vital signs: BP 95/58 mmHg  Pulse 85  Temp(Src) 98.5 F (36.9 C) (Oral)  Resp 20  Ht 5\' 6"  (1.676 m)  Wt 58.6 kg (129 lb 3 oz)  BMI 20.86 kg/m2  SpO2 99%  LMP 02/07/2014 Vital signs in last 24 hours: Temp:  [98.4 F (36.9 C)-99.2 F (37.3 C)] 98.5 F (36.9 C) (11/13 0554) Pulse Rate:  [73-88] 85 (11/13 0554) Resp:  [11-25] 20 (11/13 0554) BP: (94-130)/(53-106) 95/58 mmHg (11/13 0554) SpO2:  [98 %-100 %] 99 % (11/13 0554) Weight:  [58.6 kg (129 lb 3 oz)] 58.6 kg (129 lb 3 oz) (11/12 2004)  Intake/Output from previous day: 11/12 0701 - 11/13 0700 In: 50 [IV Piggyback:50] Out: -  Intake/Output this shift:   Nutritional status: Diet regular  Neurologic Exam: General: Mental Status: Alert, oriented, thought content appropriate.  Speech fluent without evidence of aphasia.  Able to follow 3 step commands without difficulty. Cranial Nerves: II: Discs flat bilaterally; Visual fields grossly normal, pupils equal, round, reactive to light and accommodation III,IV, VI: ptosis not present, extra-ocular motions intact bilaterally V,VII: smile symmetric, facial light touch sensation normal bilaterally VIII: hearing normal bilaterally IX,X: gag reflex present XI: bilateral shoulder shrug XII: midline tongue extension without atrophy or fasciculations  Motor: Right : Upper extremity   5/5    Left:     Upper extremity   5/5  Lower extremity   5/5     Lower extremity   5/5 Tone and bulk:normal tone throughout; no atrophy noted Sensory: Pinprick and light touch intact throughout, bilaterally Deep Tendon Reflexes:  Right: Upper Extremity   Left: Upper extremity   biceps (C-5 to C-6) 2/4   biceps (C-5 to C-6) 2/4 tricep (C7) 2/4    triceps (C7) 2/4 Brachioradialis (C6) 2/4  Brachioradialis (C6) 2/4  Lower Extremity Lower Extremity  quadriceps (L-2 to L-4) 2/4   quadriceps (L-2 to  L-4) 2/4 Achilles (S1) 2/4   Achilles (S1) 2/4  Plantars: Right: downgoing   Left: downgoing Cerebellar: normal finger-to-nose,  normal heel-to-shin test    Lab Results: Basic Metabolic Panel:  Recent Labs Lab 02/28/14 1545  NA 136*  K 4.0  CL 102  CO2 22  GLUCOSE 101*  BUN 11  CREATININE 0.72  CALCIUM 9.3    Liver Function Tests: No results for input(s): AST, ALT, ALKPHOS, BILITOT, PROT, ALBUMIN in the last 168 hours. No results for input(s): LIPASE, AMYLASE in the last 168 hours. No results for input(s): AMMONIA in the last 168 hours.  CBC:  Recent Labs Lab 02/28/14 1545  WBC 6.6  HGB 12.0  HCT 38.3  MCV 80.5  PLT 273    Cardiac Enzymes: No results for input(s): CKTOTAL, CKMB, CKMBINDEX, TROPONINI in the last 168 hours.  Lipid Panel: No results for input(s): CHOL, TRIG, HDL, CHOLHDL, VLDL, LDLCALC in the last 168 hours.  CBG:  Recent Labs Lab 02/26/14 1057 02/28/14 1452 02/28/14 1654  GLUCAP 91 78 86    Microbiology: No results found for this or any previous visit.  Coagulation Studies: No results for input(s): LABPROT, INR in the last 72 hours.  Imaging: Dg Chest 2 View  02/28/2014   CLINICAL DATA:  Shortness of breath and cough. Left lower chest pain anteriorly. Nonsmoker. The patient had 3 seizures after imaging.  EXAM: CHEST  2 VIEW  COMPARISON:  None.  FINDINGS: The heart size and mediastinal  contours are within normal limits. Both lungs are clear. The visualized skeletal structures are unremarkable.  IMPRESSION: No active cardiopulmonary disease.   Electronically Signed   By: Rosalie GumsBeth  Brown M.D.   On: 02/28/2014 16:50   Mr Laqueta JeanBrain W ZOWo Contrast  02/28/2014   CLINICAL DATA:  History seizures, multiple seizures today. Concurrent urinary tract infection.  EXAM: MRI HEAD WITHOUT AND WITH CONTRAST  TECHNIQUE: Multiplanar, multiecho pulse sequences of the brain and surrounding structures were obtained without and with intravenous contrast.   CONTRAST:  12mL MULTIHANCE GADOBENATE DIMEGLUMINE 529 MG/ML IV SOLN  COMPARISON:  None.  FINDINGS: Mild motion degraded examination.  The ventricles and sulci are normal for patient's age. Faint FLAIR hyperintense signal within the RIGHT mesial occipital lobe without definite corresponding T2 abnormality, this is favored to reflect artifact. No suspicious parenchymal signal, mass lesions, mass effect. No abnormal parenchymal enhancement. The diffusion-weighted sequences are somewhat distorted by artifact which is possibly from patient's hair. No large areas of reduced diffusion. Thin slice imaging of the temporal lobes demonstrate symmetric appearance of the hippocampi with normal size, morphology and signal characteristics. No abnormal parenchymal nor extra-axial enhancement.  No abnormal extra-axial fluid collections. No extra-axial masses nor leptomeningeal enhancement. Normal major intracranial vascular flow voids seen at the skull base.  Ocular globes and orbital contents are unremarkable. LEFT maxillary mucosal retention cyst and mild paranasal sinus mucosal thickening, no air-fluid levels. The mastoid air cells are well aerated. No suspicious calvarial bone marrow signal. No abnormal sellar expansion. Craniocervical junction maintained.  IMPRESSION: Mild motion degraded examination. No acute intracranial process ; normal MRI of the brain with and without contrast.  Mild paranasal sinusitis.   Electronically Signed   By: Awilda Metroourtnay  Bloomer   On: 02/28/2014 21:28    Medications:  Scheduled: . cefTRIAXone (ROCEPHIN)  IV  1 g Intravenous Q24H  . heparin  5,000 Units Subcutaneous 3 times per day  . levETIRAcetam  750 mg Oral BID  . levonorgestrel-ethinyl estradiol  1 tablet Oral Daily  . sodium chloride  3 mL Intravenous Q12H    Assessment/Plan:  Breakthrough seizure in setting of UTI.  MRI head negative.  EEG pending.    Recommend: 1) Treat underlying UTI 2) EEG --pending 3) Continue Keppra 750  mg BID 4) Pateint will need to follow up with her PCP at time of discharge for both seizure and Rx refills    Felicie MornDavid Shuayb Schepers PA-C [facility] [phone]  03/01/2014, 10:01 AM

## 2014-03-01 NOTE — Discharge Summary (Signed)
Physician Discharge Summary  Sharon [name] ZOX:[id number] DOB: 1995-10-10 DOA: 02/28/2014  PCP: Alva GarnetSHELTON,Lilyona R., MD  Admit date: 02/28/2014 Discharge date: 03/01/2014  Recommendations for Outpatient Follow-up:  1. Pt will need to follow up with PCP in 2-3 weeks post discharge 2. Please obtain BMP to evaluate electrolytes and kidney function 3. Please also check CBC to evaluate Hg and Hct levels 4. Cipro for 3 more days post discharge for UTI 5. Keppra increased to 750 mg BID PO  Discharge Diagnoses:  Principal Problem:   Seizures Active Problems:   Depression   Anxiety   Seizure    Discharge Condition: Stable  Diet recommendation: Heart healthy diet discussed in details    Brief narrative: 18 y.o. female with hx of seizure since age 18, previously started on Neurontin, but she didn't take them as she thought it increased her seizure frequency, started to have increase seizure activities. She was seen in the ER and was loaded with Keppra, but she continue to have seizures. She had 4 episodes of GTC seizure with post ictal periods today. She also was found to have UTI, given Vantin, but she hadn't taken it.   Assessment and Plan:    Principal Problem:  Seizures - pt clinically stable this AM - MRI brain unremarkable for acute abnormalities  - Keppra increased to 750 mg BID per neurology - EEG to be done in an outpatient setting  - appreciate neuro assistance  Active Problems:  UTI - continue Rocephin day #2 and transition to oral Cipro upon discharge  - follow up on urine cultures   Depression - stable    DVT prophylaxis  Heparin SQ while pt is in hospital  Code Status: Full Family Communication: Pt at bedside Disposition Plan: Home when medically stable   IV Access:    Peripheral IV Procedures and diagnostic studies:    Dg Chest 2 View 02/28/2014 No active cardiopulmonary disease.   Mr Laqueta JeanBrain W Wo Contrast 02/28/2014 Mild  motion degraded examination. No acute intracranial process ; normal MRI of the brain with and without contrast. Mild paranasal sinusitis.  Medical Consultants:    Neurology  Other Consultants:    None  Anti-Infectives:    Rocephin 11/12 --> transition to oral Cipro upon discharge    Discharge Exam: Filed Vitals:   03/01/14 0554  BP: 95/58  Pulse: 85  Temp: 98.5 F (36.9 C)  Resp: 20   Filed Vitals:   02/28/14 1915 02/28/14 1930 02/28/14 2004 03/01/14 0554  BP: 109/73 105/63 104/63 95/58  Pulse: 84 82 79 85  Temp:   98.4 F (36.9 C) 98.5 F (36.9 C)  TempSrc:   Oral Oral  Resp: 11 18 20 20   Height:   5\' 6"  (1.676 m)   Weight:   58.6 kg (129 lb 3 oz)   SpO2: 100% 100% 100% 99%    General: Pt is alert, follows commands appropriately, not in acute distress Cardiovascular: Regular rate and rhythm, S1/S2 +, no murmurs, no rubs, no gallops Respiratory: Clear to auscultation bilaterally, no wheezing, no crackles, no rhonchi Abdominal: Soft, non tender, non distended, bowel sounds +, no guarding Extremities: no edema, no cyanosis, pulses palpable bilaterally DP and PT Neuro: Grossly nonfocal  Discharge Instructions  Discharge Instructions    Diet - low sodium heart healthy    Complete by:  As directed      Increase activity slowly    Complete by:  As directed  Medication List    TAKE these medications        cefpodoxime 100 MG tablet  Commonly known as:  VANTIN  Take 1 tablet (100 mg total) by mouth 2 (two) times daily.     ciprofloxacin 500 MG tablet  Commonly known as:  CIPRO  Take 1 tablet (500 mg total) by mouth 2 (two) times daily.     DAYQUIL PO  Take 2 capsules by mouth daily as needed (cough).     gabapentin 300 MG capsule  Commonly known as:  NEURONTIN  Take 300 mg by mouth at bedtime.     guaiFENesin 100 MG/5ML liquid  Commonly known as:  ROBITUSSIN  Take 400 mg by mouth 3 (three) times daily as needed for cough (cough).      levETIRAcetam 750 MG tablet  Commonly known as:  KEPPRA  Take 1 tablet (750 mg total) by mouth 2 (two) times daily.     levonorgestrel-ethinyl estradiol 0.15-0.03 MG tablet  Commonly known as:  SEASONALE,INTROVALE,JOLESSA  Take 1 tablet by mouth daily.     triamcinolone cream 0.1 %  Commonly known as:  KENALOG  Apply 1 application topically 3 (three) times daily as needed (for eczema).            Follow-up Information    Follow up with Alva GarnetSHELTON,Tamiki R., MD.   Specialty:  Internal Medicine   Contact information:   [address] [place] [address] [phone]        The results of significant diagnostics from this hospitalization (including imaging, microbiology, ancillary and laboratory) are listed below for reference.     Microbiology: No results found for this or any previous visit (from the past 240 hour(s)).   Labs: Basic Metabolic Panel:  Recent Labs Lab 02/28/14 1545  NA 136*  K 4.0  CL 102  CO2 22  GLUCOSE 101*  BUN 11  CREATININE 0.72  CALCIUM 9.3   Liver Function Tests: No results for input(s): AST, ALT, ALKPHOS, BILITOT, PROT, ALBUMIN in the last 168 hours. No results for input(s): LIPASE, AMYLASE in the last 168 hours. No results for input(s): AMMONIA in the last 168 hours. CBC:  Recent Labs Lab 02/28/14 1545  WBC 6.6  HGB 12.0  HCT 38.3  MCV 80.5  PLT 273   Cardiac Enzymes: No results for input(s): CKTOTAL, CKMB, CKMBINDEX, TROPONINI in the last 168 hours. BNP: BNP (last 3 results) No results for input(s): PROBNP in the last 8760 hours. CBG:  Recent Labs Lab 02/26/14 1057 02/28/14 1452 02/28/14 1654  GLUCAP 91 78 86     SIGNED: Time coordinating discharge: Over 30 minutes  [name], [name], MD  [facility] 03/01/2014, 12:08 PM Pager [phone]  If 7PM-7AM, please contact night-coverage [email or web address] Password [email or web address]

## 2014-03-01 NOTE — Discharge Instructions (Signed)

## 2014-03-01 NOTE — Progress Notes (Addendum)
Patient ID: Sharon [name], female   DOB: [birth year], 18 y.o.   MRN: [id number]  [facility] PROGRESS NOTE  Sharon [name] [id number] DOB: [birth year] DOA: 02/28/2014 PCP: Alva GarnetSHELTON,Sharon R., MD  Brief narrative: 18 y.o. female with hx of seizure since age 18, previously started on Neurontin, but she didn't take them as she thought it increased her seizure frequency, started to have increase seizure activities. She was seen in the ER and was loaded with Keppra, but she continue to have seizures. She had 4 episodes of GTC seizure with post ictal periods today. She also was found to have UTI, given Vantin, but she hadn't taken it.   Assessment and Plan:    Principal Problem:   Seizures - pt clinically stable this AM - MRI brain unremarkable for acute abnormalities  - Keppra increased to 750 mg BID per neurology - EEG pending  - appreciate neuro assistance  Active Problems:   UTI - continue Rocephin day #2 - follow up on urine cultures    Depression - stable    DVT prophylaxis  Heparin SQ while pt is in hospital  Code Status: Full Family Communication: Pt at bedside Disposition Plan: Home when medically stable   IV Access:   Peripheral IV Procedures and diagnostic studies:   Dg Chest 2 View  02/28/2014 No active cardiopulmonary disease.     Mr Sharon JeanBrain W Wo Contrast  02/28/2014  Mild motion degraded examination. No acute intracranial process ; normal MRI of the brain with and without contrast.  Mild paranasal sinusitis.   Medical Consultants:   Neurology  Other Consultants:   None  Anti-Infectives:   Rocephin 11/12 -->   Sharon [name], [name], MD  [facility] Pager [phone]  If 7PM-7AM, please contact night-coverage [email or web address] Password [email or web address] 03/01/2014, 9:13 AM   LOS: 1 day   HPI/Subjective: No events overnight.   Objective: Filed Vitals:   02/28/14 1915 02/28/14 1930 02/28/14 2004 03/01/14 0554  BP: 109/73 105/63 104/63 95/58  Pulse: 84 82 79 85   Temp:   98.4 F (36.9 C) 98.5 F (36.9 C)  TempSrc:   Oral Oral  Resp: 11 18 20 20   Height:   5\' 6"  (1.676 m)   Weight:   58.6 kg (129 lb 3 oz)   SpO2: 100% 100% 100% 99%    Intake/Output Summary (Last 24 hours) at 03/01/14 0913 Last data filed at 03/01/14 0500  Gross per 24 hour  Intake     50 ml  Output      0 ml  Net     50 ml    Exam:   General:  Pt is alert, follows commands appropriately, not in acute distress  Cardiovascular: Regular rate and rhythm, S1/S2, no murmurs, no rubs, no gallops  Respiratory: Clear to auscultation bilaterally, no wheezing, no crackles, no rhonchi  Abdomen: Soft, non tender, non distended, bowel sounds present, no guarding  Extremities: No edema, pulses DP and PT palpable bilaterally  Neuro: Grossly nonfocal  Data Reviewed: Basic Metabolic Panel:  Recent Labs Lab 02/28/14 1545  NA 136*  K 4.0  CL 102  CO2 22  GLUCOSE 101*  BUN 11  CREATININE 0.72  CALCIUM 9.3   CBC:  Recent Labs Lab 02/28/14 1545  WBC 6.6  HGB 12.0  HCT 38.3  MCV 80.5  PLT 273   CBG:  Recent Labs Lab 02/26/14 1057 02/28/14 1452 02/28/14 1654  GLUCAP 91 78 86    Scheduled Meds: . cefTRIAXone (ROCEPHIN)  IV  1  g Intravenous Q24H  . heparin  5,000 Units Subcutaneous 3 times per day  . levETIRAcetam  750 mg Oral BID  . levonorgestrel-ethinyl estradiol  1 tablet Oral Daily  . sodium chloride  3 mL Intravenous Q12H   Continuous Infusions:

## 2014-03-02 NOTE — Progress Notes (Signed)
Pt discharged to go home, pt verbalized understanding discharge instructions, follow-up appts, and medications. Pt denied having any pain, comments, or concerns at this time.

## 2014-03-04 ENCOUNTER — Encounter: Payer: Self-pay | Admitting: Neurology

## 2014-03-04 ENCOUNTER — Ambulatory Visit (INDEPENDENT_AMBULATORY_CARE_PROVIDER_SITE_OTHER): Payer: BC Managed Care – PPO | Admitting: Neurology

## 2014-03-04 ENCOUNTER — Ambulatory Visit (HOSPITAL_COMMUNITY)
Admit: 2014-03-04 | Discharge: 2014-03-04 | Disposition: A | Discharge status: Home / Self Care | Payer: BC Managed Care – PPO | Source: Ambulatory Visit | Attending: Neurology | Admitting: Neurology

## 2014-03-04 VITALS — BP 112/70 | HR 78 | Ht 65.0 in | Wt 137.0 lb

## 2014-03-04 DIAGNOSIS — R569 Unspecified convulsions: Secondary | ICD-10-CM

## 2014-03-04 MED ORDER — LEVETIRACETAM 750 MG PO TABS: ORAL_TABLET | ORAL | Status: DC

## 2014-03-04 MED ORDER — LEVETIRACETAM 750 MG PO TABS: 750.0000 mg | ORAL_TABLET | Freq: Two times a day (BID) | ORAL | Status: DC

## 2014-03-04 NOTE — Progress Notes (Signed)
PATIENT: Sharon [name] DOB: August 04, 1995  HISTORICAL  Sharon [name] is a 18 years old right-handed African American female, referred by her school health care Sharon [name] for evaluation of seizure    She reported a history of seizures since age 18, all episodes have similar presentation, sometime she proceeded by heart beating fast, mild left-sided headaches, followed by convulsion, lost of consciousness, sometimes she workup with tongue biting, initially she has episode couple times every 3 months, but in recent 2 weeks, she had an episode daily, the last episode was yesterday evening, she was sitting on the sofa watching TV, woke up confused, she had bumped her head into furniture.  She was treated gabapentin 300 mg every night by her primary care physician,  She reported previous cardiac monitoring, for one month, no significant abnormality found,  UPDATE Nov 16th 2015; She presented to emergency room in November 10th for recurrent seizure, was given Keppra, but never compliant with the medications, then readmitted to the hospital in November 12 for recurrent multiple episode of seizure, she supposed to take Keppra 750 twice a day, MRI of the brain with and without contrast in February 28 2014 showed no acute abnormality,  [facility], I do not have access to the EEG recording.  She had recurrent seizure in March 02 2014,  Or taking Keppra 750 mg twice a day  She still a student in [employer] school, she still has 2 years left, she works at [employer] out and [employer].  REVIEW OF SYSTEMS: Full 14 system review of systems performed and notable only for chill, cough, shortness of breath, chest pain, memory loss, headache, seizure, confusion.  ALLERGIES: Allergies  Allergen Reactions  . Latex Hives  . Wheat Bran Hives  . Nickel Rash  . Tape Rash    HOME MEDICATIONS: Current Outpatient Prescriptions on File Prior to Visit  Medication Sig  Dispense Refill  . gabapentin (NEURONTIN) 300 MG capsule Take 300 mg by mouth at bedtime.      . triamcinolone cream (KENALOG) 0.1 % Apply 1 application topically 3 (three) times daily as needed (for eczema).       No current facility-administered medications on file prior to visit.    PAST MEDICAL HISTORY: Past Medical History  Diagnosis Date  . Anxiety   . Depression   . Seizure-like activity   . Sleep apnea     as a child and grew out of it    PAST SURGICAL HISTORY: Past Surgical History  Procedure Laterality Date  . None    . No past surgeries      FAMILY HISTORY: Family History  Problem Relation Age of Onset  .       SOCIAL HISTORY:  History   Social History  . Marital Status: Single    Spouse Name: N/A    Number of Children: 0  . Years of Education: college   Occupational History  . STUDENT [employer]   Social History Main Topics  . Smoking status: Never Smoker   . Smokeless tobacco: Never Used  . Alcohol Use: No  . Drug Use: No  . Sexual Activity: Not on file   Other Topics Concern  . Not on file   Social History Narrative   Patient is going to college full time . [employer].    Patient is working full time.   Right handed.   Caffeine None  PHYSICAL EXAM   Filed Vitals:   03/04/14 1058  BP: 112/70  Pulse: 78  Height: 5\' 5"  (1.651 m)  Weight: 137 lb (62.143 kg)    Not recorded      Body mass index is 22.8 kg/(m^2).   Generalized: In no acute distress  Neck: Supple, no carotid bruits   Cardiac: Regular rate rhythm  Pulmonary: Clear to auscultation bilaterally  Musculoskeletal: No deformity  Neurological examination  Mentation: Alert oriented to time, place, history taking, and causual conversation  Cranial nerve II-XII: Pupils were equal round reactive to light. Extraocular movements were full.  Visual field were full on confrontational test. Bilateral fundi were sharp.  Facial sensation and strength were normal.  Hearing was intact to finger rubbing bilaterally. Uvula tongue midline.  Head turning and shoulder shrug and were normal and symmetric.Tongue protrusion into cheek strength was normal.  Motor: Normal tone, bulk and strength.  Sensory: Intact to fine touch, pinprick, preserved vibratory sensation, and proprioception at toes.  Coordination: Normal finger to nose, heel-to-shin bilaterally there was no truncal ataxia  Gait: Rising up from seated position without assistance, normal stance, without trunk ataxia, moderate stride, good arm swing, smooth turning, able to perform tiptoe, and heel walking without difficulty.   Romberg signs: Negative  Deep tendon reflexes: Brachioradialis 2/2, biceps 2/2, triceps 2/2, patellar 2/2, Achilles 2/2, plantar responses were flexor bilaterally.   DIAGNOSTIC DATA (LABS, IMAGING, TESTING) - I reviewed patient records, labs, notes, testing and imaging myself where available.   ASSESSMENT AND PLAN  Sharon [name] is a 18 y.o. female presenting with recurrent seizure, consistent with generalized tonic-clonic seizure  1. I will increase her Keppra to 750 mg in the morning, 2 tablets in the evening 2, note generated for her to go back as a Conservation officer, naturecashier, and desk job,  She should not drive onto seizure-free for 6 months 3, return to clinic in 1-2 months with nurse practitioner, she is to document all seizure event  Levert FeinsteinYijun Sibbie Flammia, M.D. Ph.D.  [facility] [address], [address] [place], [place] [address] [phone]

## 2014-03-04 NOTE — Progress Notes (Signed)
EEG completed, results pending. 

## 2014-03-04 NOTE — Procedures (Signed)
ELECTROENCEPHALOGRAM REPORT  Patient: Sharon [name]       Room #: OP EEG No. ID: [id number] Age: 18 y.o.        Sex: female Referring Physician: Pricilla HandlerSUMNER, P Report Date:  03/04/2014        Interpreting Physician: Aline BrochureSTEWART,Merlie Noga R  History: Sharon [name] is an 18 y.o. female with a history of seizures since age 18 previously treated with Neurontin. Patient was on no anticonvulsant medication on 02/26/2014 when she was seen in the emergency room following a recurrent seizure. She was started on Keppra.  Indications for study:  Rule out interictal seizure discharges.  Technique: This is an 18 channel routine scalp EEG performed at the bedside with bipolar and monopolar montages arranged in accordance to the international 10/20 system of electrode placement.   Description: This EEG recording was performed during wakefulness and during sleep. Predominant background activity during wakefulness consisted of 10 Hz symmetrical alpha rhythm which attenuated well with eye-opening. Responses to hyperventilation and photic stimulation were normal. Symmetrical sleep spindles and arousal responses were recorded during stage II of sleep. No epileptiform discharges were seen during wakefulness nor during sleep.  Interpretation: This is a normal EEG recording during wakefulness and during sleep.   Venetia MaxonR Dezeray Puccio M.D. [facility] [phone]

## 2014-03-05 ENCOUNTER — Telehealth: Payer: Self-pay | Admitting: Neurology

## 2014-03-05 NOTE — Telephone Encounter (Signed)
[facility]aron [name] [employer] [facility] [facility] calling to get clarification on patient's Keppra script since it was sent twice with 2 different directions, please return call and advise.

## 2014-03-05 NOTE — Telephone Encounter (Signed)
OV note says: I will increase her Keppra to 750 mg in the morning, 2 tablets in the evening.  I called back.  Spoke with Casimiro NeedleMichael.  Relayed this info.  They will proceed with filling Rx.

## 2014-06-11 ENCOUNTER — Ambulatory Visit (HOSPITAL_COMMUNITY)
Admission: EM | Admit: 2014-06-11 | Discharge: 2014-06-11 | Disposition: A | Discharge status: Home / Self Care | Payer: BLUE CROSS/BLUE SHIELD | Source: Intra-hospital | Attending: Psychiatry | Admitting: Psychiatry

## 2014-06-11 ENCOUNTER — Encounter (HOSPITAL_COMMUNITY): Payer: Self-pay | Admitting: Emergency Medicine

## 2014-06-11 ENCOUNTER — Emergency Department (HOSPITAL_COMMUNITY)
Admission: EM | Admit: 2014-06-11 | Discharge: 2014-06-12 | Disposition: A | Discharge status: Home / Self Care | Payer: BLUE CROSS/BLUE SHIELD | Attending: Emergency Medicine | Admitting: Emergency Medicine

## 2014-06-11 DIAGNOSIS — Z91040 Latex allergy status: Secondary | ICD-10-CM | POA: Insufficient documentation

## 2014-06-11 DIAGNOSIS — Z008 Encounter for other general examination: Secondary | ICD-10-CM | POA: Diagnosis present

## 2014-06-11 DIAGNOSIS — F419 Anxiety disorder, unspecified: Secondary | ICD-10-CM | POA: Diagnosis not present

## 2014-06-11 DIAGNOSIS — F329 Major depressive disorder, single episode, unspecified: Secondary | ICD-10-CM | POA: Insufficient documentation

## 2014-06-11 DIAGNOSIS — G40909 Epilepsy, unspecified, not intractable, without status epilepticus: Secondary | ICD-10-CM | POA: Diagnosis not present

## 2014-06-11 DIAGNOSIS — F4310 Post-traumatic stress disorder, unspecified: Secondary | ICD-10-CM | POA: Insufficient documentation

## 2014-06-11 DIAGNOSIS — Z792 Long term (current) use of antibiotics: Secondary | ICD-10-CM | POA: Diagnosis not present

## 2014-06-11 DIAGNOSIS — Z79899 Other long term (current) drug therapy: Secondary | ICD-10-CM | POA: Insufficient documentation

## 2014-06-11 DIAGNOSIS — R45851 Suicidal ideations: Secondary | ICD-10-CM | POA: Diagnosis not present

## 2014-06-11 DIAGNOSIS — Z793 Long term (current) use of hormonal contraceptives: Secondary | ICD-10-CM | POA: Insufficient documentation

## 2014-06-11 LAB — CBC
HEMATOCRIT: 40.5 % (ref 36.0–46.0)
HEMOGLOBIN: 12.8 g/dL (ref 12.0–15.0)
MCH: 25.4 pg — ABNORMAL LOW (ref 26.0–34.0)
MCHC: 31.6 g/dL (ref 30.0–36.0)
MCV: 80.5 fL (ref 78.0–100.0)
Platelets: 255 10*3/uL (ref 150–400)
RBC: 5.03 MIL/uL (ref 3.87–5.11)
RDW: 13.3 % (ref 11.5–15.5)
WBC: 6.3 10*3/uL (ref 4.0–10.5)
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
## 2014-06-11 NOTE — ED Notes (Signed)
Pt denies SI/HI 

## 2014-06-11 NOTE — ED Notes (Signed)
[facility]

## 2014-06-12 ENCOUNTER — Encounter (HOSPITAL_COMMUNITY): Payer: Self-pay | Admitting: *Deleted

## 2014-06-12 ENCOUNTER — Inpatient Hospital Stay (HOSPITAL_COMMUNITY)
Admission: EM | Admit: 2014-06-12 | Discharge: 2014-06-19 | DRG: 885 | Disposition: A | Discharge status: Home / Self Care | Payer: BLUE CROSS/BLUE SHIELD | Source: Intra-hospital | Attending: Psychiatry | Admitting: Psychiatry

## 2014-06-12 DIAGNOSIS — F329 Major depressive disorder, single episode, unspecified: Secondary | ICD-10-CM | POA: Diagnosis not present

## 2014-06-12 DIAGNOSIS — F332 Major depressive disorder, recurrent severe without psychotic features: Secondary | ICD-10-CM | POA: Diagnosis present

## 2014-06-12 DIAGNOSIS — F4310 Post-traumatic stress disorder, unspecified: Secondary | ICD-10-CM

## 2014-06-12 DIAGNOSIS — F419 Anxiety disorder, unspecified: Secondary | ICD-10-CM | POA: Diagnosis present

## 2014-06-12 DIAGNOSIS — F333 Major depressive disorder, recurrent, severe with psychotic symptoms: Secondary | ICD-10-CM | POA: Insufficient documentation

## 2014-06-12 DIAGNOSIS — Z91410 Personal history of adult physical and sexual abuse: Secondary | ICD-10-CM | POA: Diagnosis not present

## 2014-06-12 DIAGNOSIS — G4700 Insomnia, unspecified: Secondary | ICD-10-CM | POA: Diagnosis present

## 2014-06-12 DIAGNOSIS — R45851 Suicidal ideations: Secondary | ICD-10-CM | POA: Diagnosis not present

## 2014-06-12 HISTORY — DX: Unspecified convulsions: R56.9

## 2014-06-12 LAB — COMPREHENSIVE METABOLIC PANEL
ALT: 12 U/L (ref 0–35)
AST: 17 U/L (ref 0–37)
Albumin: 4.3 g/dL (ref 3.5–5.2)
Alkaline Phosphatase: 59 U/L (ref 39–117)
Anion gap: 5 (ref 5–15)
BUN: 15 mg/dL (ref 6–23)
CALCIUM: 9.4 mg/dL (ref 8.4–10.5)
CO2: 26 mmol/L (ref 19–32)
Chloride: 106 mmol/L (ref 96–112)
Creatinine, Ser: 0.74 mg/dL (ref 0.50–1.10)
GFR calc Af Amer: >90 mL/min (ref >90)
Glucose, Bld: 156 mg/dL — ABNORMAL HIGH (ref 70–99)
POTASSIUM: 3.7 mmol/L (ref 3.5–5.1)
SODIUM: 137 mmol/L (ref 135–145)
Total Bilirubin: 0.4 mg/dL (ref 0.3–1.2)
Total Protein: 8.1 g/dL (ref 6.0–8.3)

## 2014-06-12 LAB — RAPID URINE DRUG SCREEN, HOSP PERFORMED
Amphetamines: NOT DETECTED
BENZODIAZEPINES: NOT DETECTED
Barbiturates: NOT DETECTED
Cocaine: NOT DETECTED
Opiates: NOT DETECTED
TETRAHYDROCANNABINOL: NOT DETECTED

## 2014-06-12 LAB — ACETAMINOPHEN LEVEL

## 2014-06-12 LAB — SALICYLATE LEVEL

## 2014-06-12 LAB — ETHANOL: Alcohol, Ethyl (B): <5 mg/dL (ref 0–9)

## 2014-06-12 LAB — PREGNANCY, URINE: PREG TEST UR: NEGATIVE

## 2014-06-12 MED ORDER — LEVETIRACETAM 500 MG PO TABS
500.0000 mg | ORAL_TABLET | Freq: Every day | ORAL | Status: DC
  Administered 2014-06-13 – 2014-06-18 (×6): 500 mg via ORAL
  Filled 2014-06-12 (×9): qty 1

## 2014-06-12 MED ORDER — TRIAMCINOLONE ACETONIDE 0.1 % EX CREA
1.0000 "application " | TOPICAL_CREAM | Freq: Two times a day (BID) | CUTANEOUS | Status: DC
  Administered 2014-06-12 – 2014-06-19 (×12): 1 via TOPICAL
  Filled 2014-06-12 (×3): qty 15

## 2014-06-12 MED ORDER — HYDROXYZINE HCL 25 MG PO TABS
25.0000 mg | ORAL_TABLET | Freq: Four times a day (QID) | ORAL | Status: DC | PRN
  Administered 2014-06-12 – 2014-06-14 (×2): 25 mg via ORAL
  Filled 2014-06-12 (×2): qty 1
  Filled 2014-06-12: qty 6

## 2014-06-12 MED ORDER — LEVONORGEST-ETH ESTRAD 91-DAY 0.15-0.03 MG PO TABS
1.0000 | ORAL_TABLET | Freq: Every day | ORAL | Status: DC
  Administered 2014-06-17 – 2014-06-19 (×3): 1 via ORAL

## 2014-06-12 MED ORDER — LEVETIRACETAM 250 MG PO TABS
250.0000 mg | ORAL_TABLET | Freq: Every day | ORAL | Status: DC
  Administered 2014-06-13 – 2014-06-19 (×7): 250 mg via ORAL
  Filled 2014-06-12 (×6): qty 1
  Filled 2014-06-12: qty 9
  Filled 2014-06-12 (×3): qty 1

## 2014-06-12 MED ORDER — LEVETIRACETAM 750 MG PO TABS
750.0000 mg | ORAL_TABLET | Freq: Every day | ORAL | Status: DC
  Administered 2014-06-12: 750 mg via ORAL
  Filled 2014-06-12: qty 3
  Filled 2014-06-12 (×3): qty 1

## 2014-06-12 MED ORDER — QUETIAPINE FUMARATE 50 MG PO TABS
50.0000 mg | ORAL_TABLET | Freq: Every day | ORAL | Status: DC
  Administered 2014-06-12 – 2014-06-18 (×7): 50 mg via ORAL
  Filled 2014-06-12: qty 1
  Filled 2014-06-12: qty 3
  Filled 2014-06-12 (×9): qty 1

## 2014-06-12 MED ORDER — BUPROPION HCL ER (XL) 150 MG PO TB24
150.0000 mg | ORAL_TABLET | Freq: Every day | ORAL | Status: DC
  Administered 2014-06-12: 150 mg via ORAL
  Filled 2014-06-12 (×4): qty 1

## 2014-06-12 MED ORDER — ALUM & MAG HYDROXIDE-SIMETH 200-200-20 MG/5ML PO SUSP: 30.0000 mL | ORAL | Status: DC | PRN

## 2014-06-12 MED ORDER — TRAZODONE HCL 50 MG PO TABS
50.0000 mg | ORAL_TABLET | Freq: Every evening | ORAL | Status: DC | PRN
  Administered 2014-06-12: 50 mg via ORAL
  Filled 2014-06-12 (×6): qty 1

## 2014-06-12 MED ORDER — CITALOPRAM HYDROBROMIDE 20 MG PO TABS
20.0000 mg | ORAL_TABLET | Freq: Every day | ORAL | Status: DC
Start: 2014-06-12 — End: 2014-06-19
  Administered 2014-06-12 – 2014-06-19 (×8): 20 mg via ORAL
  Filled 2014-06-12 (×6): qty 1
  Filled 2014-06-12: qty 3
  Filled 2014-06-12 (×5): qty 1

## 2014-06-12 MED ORDER — LEVETIRACETAM 750 MG PO TABS
1500.0000 mg | ORAL_TABLET | Freq: Every day | ORAL | Status: DC
  Filled 2014-06-12 (×2): qty 2

## 2014-06-12 MED ORDER — MAGNESIUM HYDROXIDE 400 MG/5ML PO SUSP
30.0000 mL | Freq: Every day | ORAL | Status: DC | PRN
Start: 2014-06-12 — End: 2014-06-19

## 2014-06-12 MED ORDER — ACETAMINOPHEN 325 MG PO TABS
650.0000 mg | ORAL_TABLET | Freq: Four times a day (QID) | ORAL | Status: DC | PRN
  Administered 2014-06-12 – 2014-06-16 (×3): 650 mg via ORAL
  Filled 2014-06-12 (×3): qty 2
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
## 2014-06-12 NOTE — Discharge Instructions (Signed)
RETURN TO BEHAVIORAL HEALTH FOR PLANNED TREATMENT.  Results for orders placed or performed during the hospital encounter of 06/11/14  Acetaminophen level  Result Value Ref Range   Acetaminophen (Tylenol), Serum <10.0 (L) 10 - 30 ug/mL  CBC  Result Value Ref Range   WBC 6.3 4.0 - 10.5 K/uL   RBC 5.03 3.87 - 5.11 MIL/uL   Hemoglobin 12.8 12.0 - 15.0 g/dL   HCT 16.140.5 09.636.0 - 04.546.0 %   MCV 80.5 78.0 - 100.0 fL   MCH 25.4 (L) 26.0 - 34.0 pg   MCHC 31.6 30.0 - 36.0 g/dL   RDW 40.913.3 81.111.5 - 91.415.5 %   Platelets 255 150 - 400 K/uL  Comprehensive metabolic panel  Result Value Ref Range   Sodium 137 135 - 145 mmol/L   Potassium 3.7 3.5 - 5.1 mmol/L   Chloride 106 96 - 112 mmol/L   CO2 26 19 - 32 mmol/L   Glucose, Bld 156 (H) 70 - 99 mg/dL   BUN 15 6 - 23 mg/dL   Creatinine, Ser 7.820.74 0.50 - 1.10 mg/dL   Calcium 9.4 8.4 - 95.610.5 mg/dL   Total Protein 8.1 6.0 - 8.3 g/dL   Albumin 4.3 3.5 - 5.2 g/dL   AST 17 0 - 37 U/L   ALT 12 0 - 35 U/L   Alkaline Phosphatase 59 39 - 117 U/L   Total Bilirubin 0.4 0.3 - 1.2 mg/dL   GFR calc non Af Amer >90 >90 mL/min   GFR calc Af Amer >90 >90 mL/min   Anion gap 5 5 - 15  Ethanol (ETOH)  Result Value Ref Range   Alcohol, Ethyl (B) <5 0 - 9 mg/dL  Salicylate level  Result Value Ref Range   Salicylate Lvl <4.0 2.8 - 20.0 mg/dL
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
## 2014-06-12 NOTE — [facility] Suicide Risk Assessment (Signed)
[facility] INPATIENT:  Family/Significant Other Suicide Prevention Education  Suicide Prevention Education:  Patient Refusal for Family/Significant Other Suicide Prevention Education: The patient Izell CarolinaKimberly Lamora has refused to provide written consent for family/significant other to be provided Family/Significant Other Suicide Prevention Education during admission and/or prior to discharge.  Physician notified.  Wynn BankerHodnett, Devario Bucklew Hairston 06/12/2014, 12:56 PM
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
## 2014-06-12 NOTE — [facility] Group Notes (Signed)
[facility] LCSW Group Therapy  Feelings Around Diagnosis 1:15 - 2:30 PM  06/12/2014 3:12 PM  Type of Therapy:  Group Therapy  Participation Level:  Did Not Attend Summary of Progress/Problems:  Sharon [name], Sharon [name] 06/12/2014, 3:12 PM

## 2014-06-12 NOTE — Plan of Care (Signed)
Problem: Ineffective individual coping Goal: STG: Patient will remain free from self harm Outcome: Progressing Pt safe on unit    Goal: STG:Pt. will utilize relaxation techniques to reduce stress STG: Patient will utilize relaxation techniques to reduce stress levels  Outcome: Progressing Pt appeared to do deep breathing exercises writer was showing pt
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
## 2014-06-12 NOTE — Progress Notes (Signed)
Patient ID: Sharon [name], female   DOB: 1996-04-10, 19 y.o.   MRN: [id number]  Pt currently presents with a flat affect and depressed behavior. Pt acts somewhat child like throughout the day today. Per self inventory, pt rates depression at a 8, hopelessness 9 and anxiety 7. Pt's daily goal is to "it is important for me to work on my anxiety" and they intend to do so by "attend group." Pt reports poor sleep, good concentration and a fair appetite. Pt reports stomach pain in the afternoon, pt given prn med, see MAR.  Pt provided with medications per providers orders. Pt's labs and vitals were monitored throughout the day. Pt supported emotionally and encouraged to express concerns and questions. Pt educated on medications.  Pt's safety ensured with 15 minute and environmental checks. Pt currently endorses SI with no plan. Pt denies HI and A/V hallucinations. Pt verbally agrees to seek staff if HI or A/VH occurs and to consult with staff before acting on self harm thoughts.

## 2014-06-12 NOTE — Progress Notes (Signed)
D: Pt denies SI/HI/AVH. Pt is pleasant and cooperative. Pt is very paranoid, pt is very anxious. Pt refused her night dose of Keppra stating she only takes 3-250 mg per day. 1 in the morning and 2 at night for a daily total of 750mg , she said she already had this amt today. Pt observed in dayroom talking very exclusively to on female patient.   A: Pt was offered support and encouragement. Pt was given scheduled medications. Pt was encourage to attend groups. Q 15 minute checks were done for safety.   R:Pt attends groups and interacts well with peers and staff. Pt is taking medication. Pt has no complaints.Pt receptive to treatment and safety maintained on unit.
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
## 2014-06-12 NOTE — ED Provider Notes (Signed)
CSN: [id number]     Arrival date & time 06/11/14  2231 History   First MD Initiated Contact with Patient 06/12/14 0014     Chief Complaint  Patient presents with  . Medical Clearance     (Consider location/radiation/quality/duration/timing/severity/associated sxs/prior Treatment) Patient is a 19 y.o. female presenting with mental health disorder. The history is provided by the patient. No language interpreter was used.  Mental Health Problem Presenting symptoms: suicidal thoughts   Associated symptoms comment:  [facility]. She states she feels better than she did previously. She is here for medical clearance [facility]   Past Medical History  Diagnosis Date  . Anxiety   . Depression   . Seizure-like activity   . Sleep apnea     as a child and grew out of it   Past Surgical History  Procedure Laterality Date  . None    . No past surgeries     Family History  Problem Relation Age of Onset  .      History  Substance Use Topics  . Smoking status: Never Smoker   . Smokeless tobacco: Never Used  . Alcohol Use: No   OB History    No data available     Review of Systems  Constitutional: Negative for fever and chills.  HENT: Negative.   Respiratory: Negative.   Cardiovascular: Negative.   Gastrointestinal: Negative.   Musculoskeletal: Negative.   Skin: Negative.   Neurological: Negative.   Psychiatric/Behavioral: Positive for suicidal ideas and dysphoric mood.      Allergies  Latex; Wheat bran; Nickel; and Tape  Home Medications   Prior to Admission medications   Medication Sig Start Date End Date Taking? Authorizing Provider  buPROPion (WELLBUTRIN XL) 150 MG 24 hr tablet Take 150 mg by mouth daily.   Yes Historical Provider, MD  levETIRAcetam (KEPPRA) 750 MG tablet One po qam, and 2 tabs qhs. Patient taking differently: Take 750-1,500 mg by mouth 2 (two) times daily. One po qam, and 2 tabs  qhs. 03/04/14  Yes Levert FeinsteinYijun Yan, MD  levonorgestrel-ethinyl estradiol (SEASONALE,INTROVALE,JOLESSA) 0.15-0.03 MG tablet Take 1 tablet by mouth daily.   Yes Historical Provider, MD  triamcinolone cream (KENALOG) 0.1 % Apply 1 application topically 3 (three) times daily as needed (for eczema).   Yes Historical Provider, MD  cefpodoxime (VANTIN) 100 MG tablet Take 1 tablet (100 mg total) by mouth 2 (two) times daily. Patient not taking: Reported on 06/11/2014 02/26/14   Toy CookeyMegan Docherty, MD  ciprofloxacin (CIPRO) 500 MG tablet Take 1 tablet (500 mg total) by mouth 2 (two) times daily. Patient not taking: Reported on 06/11/2014 03/01/14   Dorothea OgleIskra M Myers, MD   BP 104/68 mmHg  Pulse 106  Temp(Src) 98.1 F (36.7 C) (Oral)  Resp 18  SpO2 98%  LMP 05/18/2014 Physical Exam  Constitutional: She is oriented to person, place, and time. She appears well-developed and well-nourished.  HENT:  Head: Normocephalic.  Eyes: Conjunctivae are normal.  Neck: Normal range of motion. Neck supple.  Cardiovascular: Normal rate and regular rhythm.   Pulmonary/Chest: Effort normal and breath sounds normal.  Abdominal: Soft. Bowel sounds are normal. There is no tenderness. There is no rebound and no guarding.  Musculoskeletal: Normal range of motion.  Neurological: She is alert and oriented to person, place, and time.  Skin: Skin is warm and dry. No rash noted.  Psychiatric: She has a normal mood and affect.  ED Course  Procedures (including critical care time) Labs Review Labs Reviewed  ACETAMINOPHEN LEVEL - Abnormal; Notable for the following:    Acetaminophen (Tylenol), Serum <10.0 (*)    All other components within normal limits  CBC - Abnormal; Notable for the following:    MCH 25.4 (*)    All other components within normal limits  COMPREHENSIVE METABOLIC PANEL - Abnormal; Notable for the following:    Glucose, Bld 156 (*)    All other components within normal limits  ETHANOL  SALICYLATE LEVEL  URINE  RAPID DRUG SCREEN (HOSP PERFORMED)    Imaging Review No results found.   EKG Interpretation None      MDM   Final diagnoses:  None    1. Suicidal ideation  She has been medically cleared and can return to [facility].     Arnoldo Hooker, PA-C 06/12/14 1610  Ward Givens, MD 06/12/14 [phone]

## 2014-06-13 DIAGNOSIS — G4700 Insomnia, unspecified: Secondary | ICD-10-CM

## 2014-06-13 LAB — LIPID PANEL
CHOLESTEROL: 195 mg/dL (ref 0–200)
HDL: 47 mg/dL (ref >39)
LDL Cholesterol: 140 mg/dL — ABNORMAL HIGH (ref 0–99)
Total CHOL/HDL Ratio: 4.1 RATIO
Triglycerides: 39 mg/dL (ref <150)
VLDL: 8 mg/dL (ref 0–40)

## 2014-06-13 LAB — TSH: TSH: 1.466 u[IU]/mL (ref 0.350–4.500)

## 2014-06-13 MED ORDER — TRAZODONE HCL 50 MG PO TABS
25.0000 mg | ORAL_TABLET | Freq: Every evening | ORAL | Status: DC | PRN
  Administered 2014-06-13 – 2014-06-18 (×10): 25 mg via ORAL
  Filled 2014-06-13: qty 1
  Filled 2014-06-13: qty 0.5
  Filled 2014-06-13 (×5): qty 1
  Filled 2014-06-13: qty 0.5
  Filled 2014-06-13 (×10): qty 1

## 2014-06-13 NOTE — Progress Notes (Signed)
D:Pt has been isolative to her room this morning. She c/o headache and stomach ache after breakfast. Pt reported passive si thoughts for the past three weeks.  Pt rates depression and anxiety as a 6 and hopelessness as an 8 on 1-10 scale with 10 being the most.  A:Offered support, encouragement and 15 minute checks. R:Pt contracts with staff for safety. Safety maintained on the unit.

## 2014-06-13 NOTE — Progress Notes (Signed)
Adult Psychoeducational Group Note  Date:  06/13/2014 Time:  2:22 PM  Group Topic/Focus:  Overcoming Stress:   The focus of this group is to define stress and help patients assess their triggers.  Participation Level:  Active  Participation Quality:  Appropriate  Affect:  Appropriate  Cognitive:  Alert  Insight: Appropriate  Engagement in Group:  Engaged  Modes of Intervention:  Clarification and Discussion  Additional Comments:  Pt attended the group and remained appropriate and engaged throughout the duration of the group. Pt shared that work and school are her main stressors. Pt also shared that reading is her main stress reliever.   Fara Oldeneese, Coreen Shippee O 06/13/2014, 2:22 PM

## 2014-06-13 NOTE — [facility] Group Notes (Addendum)
[facility] LCSW Group Therapy  [facility] 1:15 - 2:30 PM 06/13/2014 4:00 PM  Type of Therapy:  Group Therapy  Participation Level:  Did Not Attend - patient advised of having a headache.  Wynn BankerHodnett, Reatha Sur Hairston 06/13/2014, 4:00 PM

## 2014-06-13 NOTE — Progress Notes (Signed)
Went to group 

## 2014-06-13 NOTE — Progress Notes (Signed)
D: Pt denies SI/HI/AVH. Pt is pleasant and cooperative. Pt continues to endorse depression, but stated there has been improvement [facility]. Pt continues to appear bizarre. And a little disorganized at times, but shows much improvement from last night. Pt appears to be not as anxious tonight.   A: Pt was offered support and encouragement. Pt was given scheduled medications. Pt was encourage to attend groups. Q 15 minute checks were done for safety.   R:Pt attends groups and interacts well with peers and staff. Pt is taking medication. Pt has no complaints at this time .Pt receptive to treatment and safety maintained on unit.

## 2014-06-13 NOTE — Plan of Care (Signed)
Problem: Diagnosis: Increased Risk For Suicide Attempt Goal: LTG-Patient Will Show Positive Response to Medication LTG (by discharge) : Patient will show positive response to medication and will participate in the development of the discharge plan.  Outcome: Progressing Pt behavior and speech is  Much improved from last night. Pt is not as hyper-verbal and is less disorganized with her speech.  Goal: STG-Patient Will Attend All Groups On The Unit Outcome: Progressing Pt attends group on unit.

## 2014-06-13 NOTE — Progress Notes (Signed)
Recreation Therapy Notes  Animal-Assisted Activity/Therapy (AAA/T) Program Checklist/Progress Notes Patient Eligibility Criteria Checklist & Daily Group note for Rec Tx Intervention  Date:  02.25.2016 Time: 2:45pm [facility] [address] [facility]    AAA/T Program Assumption of Risk Form signed by Patient/ or Parent Legal Guardian yes  Patient is free of allergies or sever asthma yes  Patient reports no fear of animals yes  Patient reports no history of cruelty to animals yes  Patient understands his/her participation is voluntary yes  Patient washes hands before animal contact yes  Patient washes hands after animal contact yes  Behavioral Response: Appropriate   Education: Hand Washing, Appropriate Animal Interaction   Education Outcome: Acknowledges education.   Clinical Observations/Feedback: Patient appropriately engaged with therapy dog, handler and peers during session.   Marykay Lexenise L Keeanna Villafranca, LRT/CTRS  Axzel Rockhill L 06/13/2014 4:25 PM

## 2014-06-13 NOTE — Progress Notes (Signed)
[facility] MD Progress Note  06/13/2014 5:18 PM Mionna Advincula  MRN:  [id number] Subjective:  States she remains depressed. She does admit she feels a little better today compared to how she was feeling prior to admission. She describes headache, which may be side effect from current medication regimen. Objective : I have discussed case with treatment team and have met with patient .  Patient has been more visible on unit, although presenting somewhat guarded. She is participating in some groups . Behavior described by staff as somewhat childish, no disruptive or agitated behavior. She is denying any medication side effects at present, although as noted , describes having a headache today.  She does not appear to be uncomfortable or in any acute distress or discomfort. She describes ongoing sense of depression, sadness, and some passive SI, but contracts for safety, and is future oriented. Spoke about her dream of finishing her college degree in acting and becoming a professional movie or TV actress. Sleep has improved , but remains sub-optimal. We have discussed potential of adding Minipress to medication regimen to address nightmares, but at this time prefers to continue current medications( Celexa and Seroquel)  Labs reviewed- TSH normal, lipid profile remarkable for mildly elevated LDL  Principal Problem: Depression, Insomnia, PTSD  Diagnosis:   Patient Active Problem List   Diagnosis Date Noted  . MDD (major depressive disorder), recurrent episode, severe [F33.2] 06/12/2014  . Depression [F32.9] 02/28/2014  . Anxiety [F41.9] 02/28/2014  . Seizures [R56.9] 01/10/2014   Total Time spent with patient: 25 minutes    Past Medical History:  Past Medical History  Diagnosis Date  . Anxiety   . Depression   . Seizure-like activity   . Sleep apnea     as a child and grew out of it  . Seizures     Past Surgical History  Procedure Laterality Date  . None    . No past surgeries     Family  History:  Family History  Problem Relation Age of Onset  .      Social History:  History  Alcohol Use No    Comment: 1-2 drinks maybe 2-4 times a month     History  Drug Use No    History   Social History  . Marital Status: Single    Spouse Name: N/A  . Number of Children: 0  . Years of Education: college   Occupational History  . [place]   Social History Main Topics  . Smoking status: Never Smoker   . Smokeless tobacco: Never Used  . Alcohol Use: No     Comment: 1-2 drinks maybe 2-4 times a month  . Drug Use: No  . Sexual Activity: No   Other Topics Concern  . None   Social History Narrative   Patient is going to college full time . [employer].    Patient is working full time.   Right handed.   Caffeine None   Additional History:    Sleep: Poor  Appetite:  Good   Assessment:   Musculoskeletal: Strength & Muscle Tone: within normal limits Gait & Station: normal Patient leans: N/A   Psychiatric Specialty Exam: Physical Exam  Review of Systems  Constitutional: Negative for fever and chills.  Respiratory: Negative for cough and shortness of breath.   Cardiovascular: Negative for chest pain.  Gastrointestinal: Negative for vomiting.  Genitourinary: Negative for dysuria, urgency and frequency.  Skin: Negative for rash.  Neurological: Negative.  Negative for seizures.       Has had no seizures on unit    Psychiatric/Behavioral: Positive for depression and suicidal ideas. The patient has insomnia.     Blood pressure 92/50, pulse 124, temperature 98.5 F (36.9 C), temperature source Oral, resp. rate 18, height 5' 6"  (1.676 m), weight 126 lb (57.153 kg), last menstrual period 05/18/2014, SpO2 98 %.Body mass index is 20.35 kg/(m^2).  General Appearance: improved grooming   Eye Contact::  Good  Speech:  Normal Rate  Volume:  Normal  Mood:  Depressed- but less depressed than prior to admission  Affect:  Constricted, but does smile briefly  at times   Thought Process:  Goal Directed and Linear  Orientation:  Full (Time, Place, and Person)  Thought Content:  no hallucinations, no delusions, ruminative about psychosocial stressors   Suicidal Thoughts:  Yes.  without intent/plan- passive SI, but denies any plan or intention of hurting herself on unit   Homicidal Thoughts:  No  Memory:  recent and remote grossly intact   Judgement:  Fair  Insight:  Fair  Psychomotor Activity:  Decreased  Concentration:  Good  Recall:  Good  Fund of Knowledge:Good  Language: Good  Akathisia:  NA  Handed:  Right  AIMS (if indicated):     Assets:  Desire for Improvement Resilience Talents/Skills  ADL's:  Fair   Cognition: WNL  Sleep:  Number of Hours: 2.75     Current Medications: Current Facility-Administered Medications  Medication Dose Route Frequency Provider Last Rate Last Dose  . acetaminophen (TYLENOL) tablet 650 mg  650 mg Oral Q6H PRN Laverle Hobby, PA-C   650 mg at 06/13/14 9528  . alum & mag hydroxide-simeth (MAALOX/MYLANTA) 200-200-20 MG/5ML suspension 30 mL  30 mL Oral Q4H PRN Laverle Hobby, PA-C      . citalopram (CELEXA) tablet 20 mg  20 mg Oral Daily Jenne Campus, MD   20 mg at 06/13/14 0905  . hydrOXYzine (ATARAX/VISTARIL) tablet 25 mg  25 mg Oral Q6H PRN Laverle Hobby, PA-C   25 mg at 06/12/14 2047  . levETIRAcetam (KEPPRA) tablet 250 mg  250 mg Oral Daily Jenne Campus, MD   250 mg at 06/13/14 4132  . levETIRAcetam (KEPPRA) tablet 500 mg  500 mg Oral QHS Myer Peer Shanna Un, MD   500 mg at 06/12/14 2200  . levonorgestrel-ethinyl estradiol (SEASONALE,INTROVALE,JOLESSA) 0.15-0.03 MG per tablet 1 tablet  1 tablet Oral Daily Laverle Hobby, PA-C   1 tablet at 06/12/14 0800  . magnesium hydroxide (MILK OF MAGNESIA) suspension 30 mL  30 mL Oral Daily PRN Laverle Hobby, PA-C      . QUEtiapine (SEROQUEL) tablet 50 mg  50 mg Oral QHS Jenne Campus, MD   50 mg at 06/12/14 2150  . traZODone (DESYREL) tablet 50 mg   50 mg Oral QHS,MR X 1 Laverle Hobby, PA-C   50 mg at 06/12/14 2149  . triamcinolone cream (KENALOG) 0.1 % 1 application  1 application Topical BID Laverle Hobby, PA-C   1 application at 44/01/02 1659    Lab Results:  Results for orders placed or performed during the hospital encounter of 06/12/14 (from the past 48 hour(s))  Pregnancy, urine     Status: None   Collection Time: 06/12/14  5:33 PM  Result Value Ref Range   Preg Test, Ur NEGATIVE NEGATIVE    Comment:        THE SENSITIVITY OF THIS METHODOLOGY  IS >20 mIU/mL. Performed at [facility]   TSH     Status: None   Collection Time: 06/13/14  7:05 AM  Result Value Ref Range   TSH 1.466 0.350 - 4.500 uIU/mL    Comment: Performed at [facility]  Lipid panel     Status: Abnormal   Collection Time: 06/13/14  7:05 AM  Result Value Ref Range   Cholesterol 195 0 - 200 mg/dL   Triglycerides 39 <150 mg/dL   HDL 47 >39 mg/dL   Total CHOL/HDL Ratio 4.1 RATIO   VLDL 8 0 - 40 mg/dL   LDL Cholesterol 140 (H) 0 - 99 mg/dL    Comment:        Total Cholesterol/HDL:CHD Risk Coronary Heart Disease Risk Table                     Men   Women  1/2 Average Risk   3.4   3.3  Average Risk       5.0   4.4  2 X Average Risk   9.6   7.1  3 X Average Risk  23.4   11.0        Use the calculated Patient Ratio above and the CHD Risk Table to determine the patient's CHD Risk.        ATP III CLASSIFICATION (LDL):  <100     mg/dL   Optimal  100-129  mg/dL   Near or Above                    Optimal  130-159  mg/dL   Borderline  160-189  mg/dL   High  >190     mg/dL   Very High Performed at [facility]     Physical Findings: AIMS: Facial and Oral Movements Muscles of Facial Expression: None, normal Lips and Perioral Area: None, normal Jaw: None, normal Tongue: None, normal,Extremity Movements Upper (arms, wrists, hands, fingers): None, normal Lower (legs, knees, ankles, toes): None, normal, Trunk  Movements Neck, shoulders, hips: None, normal, Overall Severity Severity of abnormal movements (highest score from questions above): None, normal Incapacitation due to abnormal movements: None, normal Patient's awareness of abnormal movements (rate only patient's report): No Awareness, Dental Status Current problems with teeth and/or dentures?: Yes (wisdom teeth are causing pain) Does patient usually wear dentures?: No  CIWA:  CIWA-Ar Total: 4 COWS:      Assessment- at this time patient partially improved, but remains depressed, mildly anxious. Some passive thoughts of death, but denies suicidal plan or intention and is future oriented at this time. On Celexa and Seroquel and is wanting to continue this medication combination trial. Has had some headache today, but unclear whether this is related to side effect from meds or not. Will follow.  Treatment Plan Summary: Daily contact with patient to assess and evaluate symptoms and progress in treatment, Medication management, Plan Continue inpatient psychiatric management and Medication management as below Seroquel 50 mgrs QHS Celexa 20 mgrs QDAY Decrease Trazodone to 25 mgrs QHS PRN insomnia   Medical Decision Making:  Established Problem, Stable/Improving (1), Review of Psycho-Social Stressors (1), Review or order clinical lab tests (1), Review of Last Therapy Session (1), Review of Medication Regimen & Side Effects (2) and Review of New Medication or Change in Dosage (2)     Melvena Vink 06/13/2014, 5:18 PM

## 2014-06-14 LAB — HEMOGLOBIN A1C
Hgb A1c MFr Bld: 5.2 % (ref 4.8–5.6)
Mean Plasma Glucose: 103 mg/dL

## 2014-06-14 MED ORDER — PRAZOSIN HCL 1 MG PO CAPS
1.0000 mg | ORAL_CAPSULE | Freq: Every day | ORAL | Status: DC
  Administered 2014-06-14 – 2014-06-18 (×5): 1 mg via ORAL
  Filled 2014-06-14 (×6): qty 1
  Filled 2014-06-14: qty 3
  Filled 2014-06-14: qty 1

## 2014-06-14 NOTE — Progress Notes (Signed)
Patient ID: Sharon [name], female   DOB: Mar 22, 1996, 19 y.o.   MRN: [id number] [facility] MD Progress Note  06/14/2014 9:08 AM Sharon [name]  MRN:  [id number] Subjective:  Patient reports partial improvement, but still some depression. Insomnia still an issue, and although does not report any headaches, states she wakes up anxious and " sweating" very frequently. Headache which she had been reporting yesterday has resolved and at this time not endorsing medication side effect. Objective : I have discussed case with treatment team and have met with patient .  As discussed with staff continues to present superficially cooperative, child like in demeanor and behavior. Remains depressed, but has improved compared to admission. No disruptive behaviors on unit. Patient denies any current medication side effects. She is visible on unit and has been going to some groups. PTSD symptoms include disrupted sleep and increased sympathetic symptoms at night time. We discussed adding Minipress to medication regimen to address and she agrees .  HgBA1c 5.2, glucose 103  TSH WNL.  Principal Problem: Depression, Insomnia, PTSD  Diagnosis:   Patient Active Problem List   Diagnosis Date Noted  . MDD (major depressive disorder), recurrent episode, severe [F33.2] 06/12/2014  . Depression [F32.9] 02/28/2014  . Anxiety [F41.9] 02/28/2014  . Seizures [R56.9] 01/10/2014   Total Time spent with patient: 20 minutes   Past Medical History:  Past Medical History  Diagnosis Date  . Anxiety   . Depression   . Seizure-like activity   . Sleep apnea     as a child and grew out of it  . Seizures     Past Surgical History  Procedure Laterality Date  . None    . No past surgeries     Family History:  Family History  Problem Relation Age of Onset  .      Social History:  History  Alcohol Use No    Comment: 1-2 drinks maybe 2-4 times a month     History  Drug Use No    History   Social History  . Marital  Status: Single    Spouse Name: N/A  . Number of Children: 0  . Years of Education: college   Occupational History  . [place]   Social History Main Topics  . Smoking status: Never Smoker   . Smokeless tobacco: Never Used  . Alcohol Use: No     Comment: 1-2 drinks maybe 2-4 times a month  . Drug Use: No  . Sexual Activity: No   Other Topics Concern  . None   Social History Narrative   Patient is going to college full time . [employer].    Patient is working full time.   Right handed.   Caffeine None   Additional History:    Sleep: Poor  Appetite:  Good   Assessment:   Musculoskeletal: Strength & Muscle Tone: within normal limits Gait & Station: normal Patient leans: N/A   Psychiatric Specialty Exam: Physical Exam  Review of Systems  Constitutional: Negative for fever and chills.  HENT:       Headache has resolved   Respiratory: Negative for cough and shortness of breath.   Cardiovascular: Negative for chest pain.  Gastrointestinal: Negative for nausea and vomiting.  Skin: Negative for rash.  Neurological: Negative for headaches.  Psychiatric/Behavioral: Positive for depression.    Blood pressure 109/69, pulse 102, temperature 98.4 F (36.9 C), temperature source Oral, resp. rate 20, height _0  (1.676 m), weight 126  lb (57.153 kg), last menstrual period 05/18/2014, SpO2 98 %.Body mass index is 20.35 kg/(m^2).  General Appearance: Fairly Groomed  Engineer, water::  Good  Speech:  Normal Rate  Volume:  Normal  Mood:  mood gradually improving- still depressed - but less depressed than prior to admission  Affect:  Constricted and anxious, improved compared to admission  Thought Process:  Goal Directed and Linear  Orientation:  Full (Time, Place, and Person)  Thought Content:  no hallucinations, no delusions, ruminative about psychosocial stressors   Suicidal Thoughts:  No- passive SI, but denies any plan or intention of hurting herself on unit    Homicidal Thoughts:  No  Memory:  recent and remote grossly intact   Judgement:  Fair  Insight:  Fair  Psychomotor Activity:  Decreased  Concentration:  Good  Recall:  Good  Fund of Knowledge:Good  Language: Good  Akathisia:  NA  Handed:  Right  AIMS (if indicated):     Assets:  Desire for Improvement Resilience Talents/Skills  ADL's:  Fair   Cognition: WNL  Sleep:  Number of Hours: 2.75     Current Medications: Current Facility-Administered Medications  Medication Dose Route Frequency Provider Last Rate Last Dose  . acetaminophen (TYLENOL) tablet 650 mg  650 mg Oral Q6H PRN Laverle Hobby, PA-C   650 mg at 06/13/14 6789  . alum & mag hydroxide-simeth (MAALOX/MYLANTA) 200-200-20 MG/5ML suspension 30 mL  30 mL Oral Q4H PRN Laverle Hobby, PA-C      . citalopram (CELEXA) tablet 20 mg  20 mg Oral Daily Jenne Campus, MD   20 mg at 06/14/14 0825  . hydrOXYzine (ATARAX/VISTARIL) tablet 25 mg  25 mg Oral Q6H PRN Laverle Hobby, PA-C   25 mg at 06/12/14 2047  . levETIRAcetam (KEPPRA) tablet 250 mg  250 mg Oral Daily Jenne Campus, MD   250 mg at 06/14/14 0827  . levETIRAcetam (KEPPRA) tablet 500 mg  500 mg Oral QHS Jenne Campus, MD   500 mg at 06/13/14 2158  . levonorgestrel-ethinyl estradiol (SEASONALE,INTROVALE,JOLESSA) 0.15-0.03 MG per tablet 1 tablet  1 tablet Oral Daily Laverle Hobby, PA-C   1 tablet at 06/12/14 0800  . magnesium hydroxide (MILK OF MAGNESIA) suspension 30 mL  30 mL Oral Daily PRN Laverle Hobby, PA-C      . prazosin (MINIPRESS) capsule 1 mg  1 mg Oral QHS Fernando A Cobos, MD      . QUEtiapine (SEROQUEL) tablet 50 mg  50 mg Oral QHS Jenne Campus, MD   50 mg at 06/13/14 2158  . traZODone (DESYREL) tablet 25 mg  25 mg Oral QHS,MR X 1 Jenne Campus, MD   25 mg at 06/13/14 2159  . triamcinolone cream (KENALOG) 0.1 % 1 application  1 application Topical BID Laverle Hobby, PA-C   1 application at 38/10/17 1659    Lab Results:  Results for  orders placed or performed during the hospital encounter of 06/12/14 (from the past 48 hour(s))  Pregnancy, urine     Status: None   Collection Time: 06/12/14  5:33 PM  Result Value Ref Range   Preg Test, Ur NEGATIVE NEGATIVE    Comment:        THE SENSITIVITY OF THIS METHODOLOGY IS >20 mIU/mL. Performed at [facility]   Hemoglobin A1c     Status: None   Collection Time: 06/13/14  7:05 AM  Result Value Ref Range   Hgb A1c  MFr Bld 5.2 4.8 - 5.6 %    Comment: (NOTE)         Pre-diabetes: 5.7 - 6.4         Diabetes: >6.4         Glycemic control for adults with diabetes: <7.0    Mean Plasma Glucose 103 mg/dL    Comment: (NOTE) Performed At: [facility] [place], [place] [id number] Lindon Romp MD HW:[id number] Performed at [facility]   TSH     Status: None   Collection Time: 06/13/14  7:05 AM  Result Value Ref Range   TSH 1.466 0.350 - 4.500 uIU/mL    Comment: Performed at [facility]  Lipid panel     Status: Abnormal   Collection Time: 06/13/14  7:05 AM  Result Value Ref Range   Cholesterol 195 0 - 200 mg/dL   Triglycerides 39 <150 mg/dL   HDL 47 >39 mg/dL   Total CHOL/HDL Ratio 4.1 RATIO   VLDL 8 0 - 40 mg/dL   LDL Cholesterol 140 (H) 0 - 99 mg/dL    Comment:        Total Cholesterol/HDL:CHD Risk Coronary Heart Disease Risk Table                     Men   Women  1/2 Average Risk   3.4   3.3  Average Risk       5.0   4.4  2 X Average Risk   9.6   7.1  3 X Average Risk  23.4   11.0        Use the calculated Patient Ratio above and the CHD Risk Table to determine the patient's CHD Risk.        ATP III CLASSIFICATION (LDL):  <100     mg/dL   Optimal  100-129  mg/dL   Near or Above                    Optimal  130-159  mg/dL   Borderline  160-189  mg/dL   High  >190     mg/dL   Very High Performed at [facility]     Physical Findings: AIMS: Facial and Oral Movements Muscles of  Facial Expression: None, normal Lips and Perioral Area: None, normal Jaw: None, normal Tongue: None, normal,Extremity Movements Upper (arms, wrists, hands, fingers): None, normal Lower (legs, knees, ankles, toes): None, normal, Trunk Movements Neck, shoulders, hips: None, normal, Overall Severity Severity of abnormal movements (highest score from questions above): None, normal Incapacitation due to abnormal movements: None, normal Patient's awareness of abnormal movements (rate only patient's report): No Awareness, Dental Status Current problems with teeth and/or dentures?: Yes (wisdom teeth are causing pain) Does patient usually wear dentures?: No  CIWA:  CIWA-Ar Total: 4 COWS:      Assessment-  Patient partially improved compared to admission, although still depressed. No SI or psychotic symptoms at present . Tolerating medications well and initial headache she had been reporting has resolved at this time. Sleep is disrupted by frequent waking episodes and night time agitation and diaphoresis, sometimes nightmares . Agrees to  Minipress trial.   Treatment Plan Summary: Daily contact with patient to assess and evaluate symptoms and progress in treatment, Medication management, Plan Continue inpatient psychiatric management and Medication management as below Seroquel 50 mgrs QHS Celexa 20 mgrs QDAY Decrease Trazodone to 25 mgrs QHS PRN insomnia Start Minipress 1  mgr QHS to address nightmares .   Medical Decision Making:  Established Problem, Stable/Improving (1), Review of Psycho-Social Stressors (1), Review or order clinical lab tests (1), Review of Last Therapy Session (1), Review of Medication Regimen & Side Effects (2) and Review of New Medication or Change in Dosage (2)     COBOS, FERNANDO 06/14/2014, 9:08 AM

## 2014-06-14 NOTE — [facility] Group Notes (Signed)
[facility]   06/14/2014 3:46 PM    Participation Quality:  Appropraite  Mood/Affect:  Appropriate  Depression Rating:  8  Anxiety Rating:  7  Thoughts of Suicide:  No  Will you contract for safety?   NA  Current AVH:  No  Plan for Discharge/Comments:  Patient attended discharge planning group and actively participated in group. Patient will return to her home and follow up with [employer].  Suicide prevention education reviewed and SPE document provided.   Transportation Means: Patient has transportation.   Supports:  Patient has a support system.   Kacper Cartlidge, Joesph JulyQuylle Hairston

## 2014-06-14 NOTE — Progress Notes (Signed)
Patient ID: Sharon [name], female   DOB: [birth year], 19 y.o.   MRN: [id number] D: Client is visible on the unit in dayroom watching TV, at one point dancing. Client reports "I had a panic attack today, I had a bad night and I had a nervous stomach, so I sat on the floor to rock and get rid of it" Client reports anxiety tonight at "6" of 10. Client goal today "to attend all the groups, I only missed one" A: Clinical research associatewriter provided emotional support, encouraged client to use coping skills, i.e. deep breathing technique to help combat anxiety. Staff will monitor q7615min for safety. R: client is safe on the unit, attended group.

## 2014-06-14 NOTE — [facility] Suicide Risk Assessment (Signed)
[facility] INPATIENT:  Family/Significant Other Suicide Prevention Education  Suicide Prevention Education:  Education Completed; Ronnell GuadalajaraSelina Fazzino, Mother - [phone]; has been identified by the patient as the family member/significant other with whom the patient will be residing, and identified as the person(s) who will aid the patient in the event of a mental health crisis (suicidal ideations/suicide attempt).  With written consent from the patient, the family member/significant other has been provided the following suicide prevention education, prior to the and/or following the discharge of the patient.  The suicide prevention education provided includes the following:  Suicide risk factors  Suicide prevention and interventions  National Suicide Hotline telephone number  [facility] assessment telephone number  [facility] Assistance 911  [facility] and/or Residential Mobile Crisis Unit telephone number  Request made of family/significant other to:  Remove weapons (e.g., guns, rifles, knives), all items previously/currently identified as safety concern.  Mother patient does not have access to weapons.    Remove drugs/medications (over-the-counter, prescriptions, illicit drugs), all items previously/currently identified as a safety concern.  The family member/significant other verbalizes understanding of the suicide prevention education information provided.  The family member/significant other agrees to remove the items of safety concern listed above.  Wynn BankerHodnett, Menucha Dicesare Hairston 06/14/2014, 3:51 PM

## 2014-06-14 NOTE — Progress Notes (Signed)
Patient ID: Izell CarolinaKimberly Feagans, female   DOB: 1996-04-01, 19 y.o.   MRN: [id number]  Pt currently presents with a flat affect and pleasant behavior. Per self inventory, pt rates depression at a 8, hopelessness 9 and anxiety 4. Pt's daily goal is "getting my depression in control" and they intend to do so by "attend groups." Pt reports fair sleep, good concentration and a fair appetite.   Pt provided with medications per providers orders. Pt's labs and vitals were monitored throughout the day. Pt supported emotionally and encouraged to express concerns and questions. Pt educated on medications.  Pt's safety ensured with 15 minute and environmental checks. Pt currently denies SI/HI and A/V hallucinations. Pt verbally agrees to seek staff if SI/HI or A/VH occurs and to consult with staff before acting on these thoughts. Pt exhibits childlike behavior throughout the day today. Pt talkative with other pts and remains in the dayroom throughout most of the day.

## 2014-06-14 NOTE — [facility] Group Notes (Signed)
[facility] LCSW Group Therapy [facility] LCSW Group Therapy  Feelings Around Relapse 1:15 -2:30        06/14/2014   Type of Therapy:  Group Therapy  Participation Level:  Appropriate  Participation Quality:  Appropriate  Affect:  Appropriate  Cognitive:  Attentive Appropriate  Insight:  Developing/Improving  Engagement in Therapy: Developing/Improving  Modes of Intervention:  Discussion Exploration Problem-Solving Supportive  Summary of Progress/Problems:  The topic for today was feelings around relapse. Patient processed feelings toward relapse and was able to relate to peers. Patient advised relasping for not disciplining herself.  She shared she then starts to drinking or use other drugs to cope. Patient identified coping skills that can be used to prevent a relapse is staying on her medications.Sharon [name].   [name] 06/14/2014

## 2014-06-14 NOTE — Progress Notes (Addendum)
Patient ID: Sharon [name], female   DOB: [birth year], 19 y.o.   MRN: [id number]  Pt found on the floor in room. Pt laying with a pillow and blanket covering her face. Upon inquiry and encouragement pt told writer that "it makes me feel better on the floor." Pt told writer she was experiencing "I feel like I'm expecting something, like I have somewhere to go but I'm not sure I do, I think it's the medication." Pt also stated "I don't want to go up front, I don't want to be stared at." Pt could not verbalize who was "staring" at her. Pt is stuttering and trembling during first interaction. At the end of interaction, pt's breathing is regular, no tremors noted and stuttering no longer present. Pt given prn anxiety medication and encouraged to sleep in the bed. Pt endorsed passive SI and verbally agreed to contact staff before acting on any self harm thoughts.

## 2014-06-14 NOTE — Plan of Care (Signed)
Problem: Alteration in mood; excessive anxiety as evidenced by: Goal: LTG-Patient's behavior demonstrates decreased anxiety Goal not met. Patient is rating anxiety at seven. Goal is for patient to rate anxiety at three or below on discharge.  Joette Catching, LCSW Clinical Social Worker [phone]  06/12/2014 10:29 AM  (Patient's behavior demonstrates anxiety and he/she is utilizing learned coping skills to deal with anxiety-producing situations)  Outcome: Not Progressing Pt has increased anxiety throughout the day. Pt not able to verbalize anxiety without verbal encouragement.

## 2014-06-14 NOTE — Tx Team (Signed)
Interdisciplinary Treatment Plan Update   Date Reviewed:  06/14/2014  Time Reviewed:  3:54 PM  Progress in Treatment:   Attending groups: Yes, patient attend group. Participating in groups: Yes, patient participates in group Taking medication as prescribed: Yes  Tolerating medication: Yes Family/Significant other contact made:  Yes, collateral contact with mother. Patient understands diagnosis:.Yes, patient understands diagnosis and need for treatment. Discussing patient identified problems/goals with staff:Yes, patient is able to express goals for treatment and discharge. Medical problems stabilized or resolved: Yes Denies suicidal/homicidal ideation: Yes Patient has not harmed self or others: Yes  For review of initial/current patient goals, please see plan of care.  Estimated Length of Stay:  3-4 days  Reasons for Continued Hospitalization:  Anxiety Depression Medication stabilization Suicidal ideation  New Problems/Goals identified:    Discharge Plan or Barriers:   Home with outpatient follow up to be determined  Additional Comments:  Patient and CSW reviewed patient's identified goals and treatment plan.  Patient verbalized understanding and agreed to treatment plan.   Attendees:  Patient:  06/14/2014 3:54 PM   Signature:  Sallyanne HaversF. Cobos, MD 06/14/2014 3:54 PM  Signature: Geoffery LyonsIrving Lugo, MD 06/14/2014 3:54 PM  Signature:  Rodman KeyJanet Webb, RN 06/14/2014 3:54 PM  Signature: Scot JunJennifer Pritchard, RN 06/14/2014 3:54 PM  Signature:   06/14/2014 3:54 PM  Signature:  Juline PatchQuylle Zimir Kittleson, LCSW 06/14/2014 3:54 PM  Signature:  Samuella BruinKristin Drinkard, LCSW-A 06/14/2014 3:54 PM  Signature:  Leisa LenzValerie Enoch, Care Coordinator [facility] 06/14/2014 3:54 PM  Signature:  Earl ManySara Twyman, RN 06/14/2014 3:54 PM   06/14/2014  3:54 PM   06/14/2014  3:54 PM  Signature:   06/14/2014  3:54 PM    Scribe for Treatment Team:   Juline PatchQuylle Jameriah Trotti,  06/14/2014 3:54 PM

## 2014-06-14 NOTE — [facility] Group Notes (Signed)
[facility] Group Notes:  (Nursing/MHT/Case Management/Adjunct)  Date:  06/14/2014  Time:  1000  Type of Therapy:  Psychoeducational Skills  Participation Level:  Active  Participation Quality:  Appropriate  Affect:  Irritable  Cognitive:  Appropriate  Insight:  Appropriate  Engagement in Group:  Engaged  Modes of Intervention:  Activity and Discussion  Summary of Progress/Problems:  Layla BarterWhite, Kailyn Dubie L 06/14/2014, 12:29 PM

## 2014-06-15 DIAGNOSIS — F329 Major depressive disorder, single episode, unspecified: Secondary | ICD-10-CM

## 2014-06-15 NOTE — [facility] Group Notes (Signed)
[facility] Group Notes:  (Clinical Social Work)  06/15/2014   1:15-2:15PM  Summary of Progress/Problems:   The main focus of today's process group was for the patient to identify ways in which they have sabotaged their own mental health wellness/recovery.  Motivational interviewing and a handout were used to explore the benefits and costs of their self-sabotaging behavior as well as the benefits and costs of changing this behavior.  The Stages of Change were explained to the group using a handout, and patients identified where they are with regard to changing self-defeating behaviors.  The patient expressed she self-sabotages with people pleasing and with isolation.  She participated heavily in group, expressed that she thoroughly learned the concepts presented.  Type of Therapy:  Process Group  Participation Level:  Active  Participation Quality:  Attentive, Sharing and Supportive  Affect:  Appropriate  Cognitive:  Alert and Appropriate  Insight:  Engaged  Engagement in Therapy:  Engaged  Modes of Intervention:  Education, Motivational Interviewing   Ambrose MantleMareida Grossman-Orr, LCSW 06/15/2014, 4:00pm

## 2014-06-15 NOTE — Progress Notes (Signed)
D) Pt has attended the groups and interacts appropriately with her peers. Rates her depression at a 3, hopelessness at a 3 and her anxiety at a 1. Pt. participated in the groups fully and has awareness about herself and her illness. Wrote on her inventory sheet, "I am afaired I will loss my discipline". A) Given support, reassurance and praise. Encouragement given to complete her packet for today. Therapeutic humor used with Pt. Provided with a  1:1. R) Pt denies SI and HI.

## 2014-06-15 NOTE — Progress Notes (Signed)
.  Psychoeducational Group Note    Date: 06/15/2014 Time: 0930    Goal Setting Purpose of Group: To be able to set a goal that is measurable and that can be accomplished in one day Participation Level:  Active  Participation Quality:  Appropriate  Affect:  Appropriate  Cognitive:  Oriented  Insight:  Improving  Engagement in Group:  Engaged  Additional Comments:  Pt participating and adding to the group.   Raeshaun Simson A  

## 2014-06-15 NOTE — Progress Notes (Signed)
.  Psychoeducational Group Note    Date: 06/15/2014 Time: 0930    Goal Setting Purpose of Group: To be able to set a goal that is measurable and that can be accomplished in one day Participation Level:  Active  Participation Quality:  Appropriate  Affect:  Appropriate  Cognitive:  Oriented  Insight:  Improving  Engagement in Group:  Engaged  Additional Comments:  Pt participating and adding to the group.   Lavita Pontius A  
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
## 2014-06-15 NOTE — Progress Notes (Signed)
D.  Pt remained in bed during group, but did come up afterwards for her medication and snacks.  Pt would like a print out of all the medications she is receiving.  Pt denies SI/HI/hallucinations at this time.  Interacting appropriately with peers this evening.  A.  Support and encouragement offered  R.  Pt remains safe on unit, will continue to monitor.

## 2014-06-16 NOTE — [facility] Group Notes (Signed)
[facility] Group Notes: (Clinical Social Work)   06/16/2014      Type of Therapy:  Group Therapy   Participation Level:  Did Not Attend despite MHT prompting   Ambrose MantleMareida Grossman-Orr, LCSW 06/16/2014, 2:25 PM

## 2014-06-16 NOTE — Progress Notes (Signed)
Patient ID: Sharon [name], female   DOB: November 17, 1995, 19 y.o.   MRN: [id number] [facility] MD Progress Note  06/16/2014 3:11 PM Puja Caffey  MRN:  [id number]   Subjective:      Rates depression  1/10 Anxiety 0/10  Appetite and sleep fair.   Denies SI AVH   She is calm and cooperative but child like in demeanor and behavior.    Principal Problem: Depression, Insomnia, PTSD  Diagnosis:   Patient Active Problem List   Diagnosis Date Noted  . MDD (major depressive disorder), recurrent episode, severe [F33.2] 06/12/2014  . Depression [F32.9] 02/28/2014  . Anxiety [F41.9] 02/28/2014  . Seizures [R56.9] 01/10/2014   Total Time spent with patient: 20 minutes   Past Medical History:  Past Medical History  Diagnosis Date  . Anxiety   . Depression   . Seizure-like activity   . Sleep apnea     as a child and grew out of it  . Seizures     Past Surgical History  Procedure Laterality Date  . None    . No past surgeries     Family History:  Family History  Problem Relation Age of Onset  .      Social History:  History  Alcohol Use No    Comment: 1-2 drinks maybe 2-4 times a month     History  Drug Use No    History   Social History  . Marital Status: Single    Spouse Name: N/A  . Number of Children: 0  . Years of Education: college   Occupational History  . STUDENT [employer]   Social History Main Topics  . Smoking status: Never Smoker   . Smokeless tobacco: Never Used  . Alcohol Use: No     Comment: 1-2 drinks maybe 2-4 times a month  . Drug Use: No  . Sexual Activity: No   Other Topics Concern  . None   Social History Narrative   Patient is going to college full time . [employer].    Patient is working full time.   Right handed.   Caffeine None   Additional History:    Sleep: fair  Appetite:  Good   Assessment:   Musculoskeletal: Strength & Muscle Tone: within normal limits Gait & Station: normal Patient leans: N/A   Psychiatric  Specialty Exam: Physical Exam  Constitutional: She is oriented to person, place, and time. She appears well-developed and well-nourished.  Neck: Normal range of motion.  Musculoskeletal: Normal range of motion.  Neurological: She is alert and oriented to person, place, and time.  Skin: Skin is warm and dry.    ROS  Blood pressure 97/54, pulse 104, temperature 98.1 F (36.7 C), temperature source Oral, resp. rate 20, height  (1.676 m), weight 57.153 kg (126 lb), last menstrual period 05/18/2014, SpO2 98 %.Body mass index is 20.35 kg/(m^2).  General Appearance: casual  Eye Contact::  Good  Speech:  Normal Rate  Volume:  Normal  Mood:  mood gradually improving- still depressed -   Affect:  Euthymic   Thought Process:  Goal Directed and Linear  Orientation:  Full (Time, Place, and Person)  Thought Content:  no hallucinations, no delusions, ruminative about psychosocial stressors   Suicidal Thoughts:  No- passive SI, but denies any plan or intention of hurting herself on unit   Homicidal Thoughts:  No  Memory:  recent and remote grossly intact   Judgement:  Fair  Insight:  Fair  Psychomotor Activity:  Decreased  Concentration:  Good  Recall:  Good  Fund of Knowledge:Good  Language: Good  Akathisia:  NA  Handed:  Right  AIMS (if indicated):     Assets:  Desire for Improvement Resilience Talents/Skills  ADL's:  Fair   Cognition: WNL  Sleep:  Number of Hours: 2.75     Current Medications: Current Facility-Administered Medications  Medication Dose Route Frequency Provider Last Rate Last Dose  . acetaminophen (TYLENOL) tablet 650 mg  650 mg Oral Q6H PRN Kerry HoughSpencer E Simon, PA-C   650 mg at 06/16/14 1456  . alum & mag hydroxide-simeth (MAALOX/MYLANTA) 200-200-20 MG/5ML suspension 30 mL  30 mL Oral Q4H PRN Kerry HoughSpencer E Simon, PA-C      . citalopram (CELEXA) tablet 20 mg  20 mg Oral Daily Craige CottaFernando A Cobos, MD   20 mg at 06/16/14 [id number]  . hydrOXYzine (ATARAX/VISTARIL) tablet 25 mg  25  mg Oral Q6H PRN Kerry HoughSpencer E Simon, PA-C   25 mg at 06/14/14 1613  . levETIRAcetam (KEPPRA) tablet 250 mg  250 mg Oral Daily Craige CottaFernando A Cobos, MD   250 mg at 06/16/14 0813  . levETIRAcetam (KEPPRA) tablet 500 mg  500 mg Oral QHS Craige CottaFernando A Cobos, MD   500 mg at 06/15/14 2142  . levonorgestrel-ethinyl estradiol (SEASONALE,INTROVALE,JOLESSA) 0.15-0.03 MG per tablet 1 tablet  1 tablet Oral Daily Kerry HoughSpencer E Simon, PA-C   1 tablet at 06/12/14 0800  . magnesium hydroxide (MILK OF MAGNESIA) suspension 30 mL  30 mL Oral Daily PRN Kerry HoughSpencer E Simon, PA-C      . prazosin (MINIPRESS) capsule 1 mg  1 mg Oral QHS Craige CottaFernando A Cobos, MD   1 mg at 06/15/14 2142  . QUEtiapine (SEROQUEL) tablet 50 mg  50 mg Oral QHS Craige CottaFernando A Cobos, MD   50 mg at 06/15/14 2143  . traZODone (DESYREL) tablet 25 mg  25 mg Oral QHS,MR X 1 Rockey SituFernando A Cobos, MD   25 mg at 06/15/14 2308  . triamcinolone cream (KENALOG) 0.1 % 1 application  1 application Topical BID Kerry HoughSpencer E Simon, PA-C   1 application at 06/16/14 1219    Lab Results:  No results found for this or any previous visit (from the past 48 hour(s)).  Physical Findings: AIMS: Facial and Oral Movements Muscles of Facial Expression: None, normal Lips and Perioral Area: None, normal Jaw: None, normal Tongue: None, normal,Extremity Movements Upper (arms, wrists, hands, fingers): None, normal Lower (legs, knees, ankles, toes): None, normal, Trunk Movements Neck, shoulders, hips: None, normal, Overall Severity Severity of abnormal movements (highest score from questions above): None, normal Incapacitation due to abnormal movements: None, normal Patient's awareness of abnormal movements (rate only patient's report): No Awareness, Dental Status Current problems with teeth and/or dentures?: No Does patient usually wear dentures?: No  CIWA:  CIWA-Ar Total: 3 COWS:      Assessment-  Patient feels better but anxious about discharge  No SI or psychotic symptoms at present . Tolerating  medications well and no side effects reported.  Denies nightmares. Treatment Plan Summary: Daily contact with patient to assess and evaluate symptoms and progress in treatment, Medication management, Plan Continue inpatient psychiatric management and Medication management as below Seroquel 50 mgrs QHS Celexa 20 mgrs QDAY Decrease Trazodone to 25 mgrs QHS PRN insomnia Start Minipress 1 mgr QHS to address nightmares .  -denies side effects  Medical Decision Making:  Established Problem, Stable/Improving (1), Review of Psycho-Social Stressors (1), Review or order clinical lab tests (  1), Review of Last Therapy Session (1), Review of Medication Regimen & Side Effects (2) and Review of New Medication or Change in Dosage (2)  LARACH, MARY PMHNP 06/16/2014, 3:11 PM  I agreed with the findings, treatment and disposition plan of this patient. Kathryne Sharper, MD

## 2014-06-16 NOTE — Progress Notes (Signed)
Psychoeducational Group Note  Date: 06/16/2014 Time: 1000 Group Topic/Focus:  Gratefulness:  The focus of this group is to help patients identify what two things they are most grateful for in their lives. What helps ground them and to center them on their work to their recovery.  Participation Level:  Active  Participation Quality:  Attentive  Affect:  Flat  Cognitive:  Oriented  Insight:  Improving  Engagement in Group:  Engaged  Additional Comments:   Bronislaus Verdell A  

## 2014-06-16 NOTE — Progress Notes (Signed)
D.  Pt pleasant on approach, denies complaints at this time.  Positive for evening wrap up group, interacting appropriately with peers on the unit.  Denies SI/HI/hallucinations at this time.  A.  Support and encouragement offered  R.  Pt remains safe on the unit, will continue to monitor.  

## 2014-06-16 NOTE — Plan of Care (Signed)
Problem: Diagnosis: Increased Risk For Suicide Attempt Goal: LTG-Patient Will Report Improved Mood and Deny Suicidal LTG (by discharge) Patient will report improved mood and deny suicidal ideation.  Outcome: Progressing Pt denies suicidal ideation this evening, got up and attended evening wrap-up group.  Pleasant, bright demeanor and interacting appropriately with peers.

## 2014-06-16 NOTE — Progress Notes (Signed)
D) Pt has attended the groups and has interacted with her peers appropriately. Pt rates her depression at a 1, her hopelessness at a 4 and her anxiety at a 0. Denies SI and HI. States that she is feeling better overall and is ready to go back to school.  A) Given support, reassurance and praise. Encouragement given to Pt. Provided with a 1:1. R) Denies SI and HI.

## 2014-06-17 DIAGNOSIS — F332 Major depressive disorder, recurrent severe without psychotic features: Principal | ICD-10-CM

## 2014-06-17 DIAGNOSIS — F4310 Post-traumatic stress disorder, unspecified: Secondary | ICD-10-CM | POA: Diagnosis present

## 2014-06-17 MED ORDER — TRAZODONE HCL 50 MG PO TABS
ORAL_TABLET | ORAL | Status: AC
  Filled 2014-06-17: qty 1

## 2014-06-17 NOTE — [facility] Group Notes (Signed)
[facility] LCSW Group Therapy 06/17/2014  1:15 pm  Type of Therapy: Group Therapy Participation Level: Active  Participation Quality: Attentive, Sharing and Supportive  Affect: Appropriate  Cognitive: Alert and Oriented  Insight: Developing/Improving and Engaged  Engagement in Therapy: Developing/Improving and Engaged  Modes of Intervention: Clarification, Confrontation, Discussion, Education, Exploration,  Limit-setting, Orientation, Problem-solving, Rapport Building, Dance movement psychotherapisteality Testing, Socialization and Support  Summary of Progress/Problems: Pt identified obstacles faced currently and processed barriers involved in overcoming these obstacles. Pt identified steps necessary for overcoming these obstacles and explored motivation (internal and external) for facing these difficulties head on. Pt further identified one area of concern in their lives and chose a goal to focus on for today. Patient identified her goal as to stay on a treatment regiment with regards to her aftercare and medications. Patient reports that she has supports in place through 2 agencies. Patient identified her obstacle as "being lazy" but reports that she is motivated to continue taking her medications at discharge. Patient and group engaged in discussion on assessing social supports in the midst of a crisis. CSW and other group members provided patient with emotional support and encouragement.  Samuella BruinKristin Kendy Haston, MSW, [employer]WA Clinical Social Worker [facility] [phone]

## 2014-06-17 NOTE — Progress Notes (Signed)
D: Patient in her room in bed on approach.  Patient states she has a good day.  Patient states she has been going to groups.  Patient states since she has been here she has learned some coping skills.  Patient states her goal is to stay positive.  Patient states she is going to try to work on getting some rest.  Patient denies SI/HI and denies AVH A: Staff to monitor Q 15 mins for safety.  Encouragement and support offered.  Scheduled medications administered per orders. R: Patient remains safe on the unit.  Patient attended group tonight.  Patient visible on the unit and interacting with peers.  Patient taking administered medcaitions.

## 2014-06-17 NOTE — [facility] Group Notes (Signed)
   [facility]  06/17/2014  8:45 AM   Participation Quality: Alert, Appropriate and Oriented  Mood/Affect: Appropriate  Depression Rating: 1  Anxiety Rating: 8  Thoughts of Suicide: Pt denies SI/HI  Will you contract for safety? Yes  Current AVH: Pt denies  Plan for Discharge/Comments: Pt attended discharge planning group and actively participated in group. CSW provided pt with today's workbook. Patient reports that she will return home to follow up with [facility] at discharge.  Transportation Means: Pt reports access to transportation  Supports: No supports mentioned at this time  Samuella BruinKristin Joneisha Miles, MSW, [employer]CSWA Clinical Social Worker [facility] [phone]
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
## 2014-06-17 NOTE — Progress Notes (Signed)
Patient ID: Sharon [name], female   DOB: 08-03-1995, 19 y.o.   MRN: [id number] [facility] MD Progress Note  06/17/2014 3:31 PM Sharon [name]  MRN:  [id number]   Subjective: Patient states "I feel my medications are working.'  Objective: Patient seen and chart reviewed.Patient presented with worsening depression as well as PTSD sx as well as SI . Patient today with improvement of her symptoms. Patient reports that the nightmares are not too bad now and that her medications are working. Patient with improved sleep. Patient is compliant on medications. Denies new concerns. Patient denies SI. Patient however continues to have HI towards her room mate and calls her "the main trigger". Patient encouraged to attend group activities.  Per staff , no new concerns.Reviewed notes from the weekend.     .    Principal Problem: Depression, Insomnia, PTSD  Diagnosis:   Patient Active Problem List   Diagnosis Date Noted  . MDD (major depressive disorder), recurrent episode, severe [F33.2] 06/12/2014  . Depression [F32.9] 02/28/2014  . Anxiety [F41.9] 02/28/2014  . Seizures [R56.9] 01/10/2014   Total Time spent with patient: 20 minutes   Past Medical History:  Past Medical History  Diagnosis Date  . Anxiety   . Depression   . Seizure-like activity   . Sleep apnea     as a child and grew out of it  . Seizures     Past Surgical History  Procedure Laterality Date  . None    . No past surgeries     Family History:  Family History  Problem Relation Age of Onset  .      Social History:  History  Alcohol Use No    Comment: 1-2 drinks maybe 2-4 times a month     History  Drug Use No    History   Social History  . Marital Status: Single    Spouse Name: N/A  . Number of Children: 0  . Years of Education: college   Occupational History  . STUDENT [employer]   Social History Main Topics  . Smoking status: Never Smoker   . Smokeless tobacco: Never Used  . Alcohol Use: No     Comment: 1-2 drinks maybe 2-4 times a month  . Drug Use: No  . Sexual Activity: No   Other Topics Concern  . None   Social History Narrative   Patient is going to college full time . [employer].    Patient is working full time.   Right handed.   Caffeine None   Additional History:    Sleep: fair  Appetite:  Good     Musculoskeletal: Strength & Muscle Tone: within normal limits Gait & Station: normal Patient leans: N/A   Psychiatric Specialty Exam: Physical Exam  Constitutional: She is oriented to person, place, and time. She appears well-developed and well-nourished.  Neck: Normal range of motion.  Musculoskeletal: Normal range of motion.  Neurological: She is alert and oriented to person, place, and time.  Skin: Skin is warm and dry.    Review of Systems  Psychiatric/Behavioral: Positive for substance abuse. The patient is nervous/anxious.     Blood pressure 101/64, pulse 120, temperature 97.3 F (36.3 C), temperature source Oral, resp. rate 14, height  (1.676 m), weight 57.153 kg (126 lb), last menstrual period 05/18/2014, SpO2 98 %.Body mass index is 20.35 kg/(m^2).  General Appearance: fairly groomed , sat with a bed sheet over her head.  Eye Contact::  Good  Speech:  Normal Rate  Volume:  Normal  Mood:  mood gradually improving- still depressed -   Affect:  Euthymic   Thought Process:  Goal Directed and Linear  Orientation:  Full (Time, Place, and Person)  Thought Content:  no hallucinations, no delusions, ruminative about psychosocial stressors   Suicidal Thoughts:  No  Homicidal Thoughts:  Yes.  without intent/plan  Memory:  Immediate;   Fair Recent;   Fair Remote;   Fair  Judgement:  Fair  Insight:  Fair  Psychomotor Activity:  Decreased  Concentration:  Good  Recall:  Good  Fund of Knowledge:Good  Language: Good  Akathisia:  NA  Handed:  Right  AIMS (if indicated):     Assets:  Desire for Improvement Resilience Talents/Skills  ADL's:  Fair    Cognition: WNL  Sleep:  Number of Hours: 6     Current Medications: Current Facility-Administered Medications  Medication Dose Route Frequency Provider Last Rate Last Dose  . acetaminophen (TYLENOL) tablet 650 mg  650 mg Oral Q6H PRN Kerry HoughSpencer E Simon, PA-C   650 mg at 06/16/14 1456  . alum & mag hydroxide-simeth (MAALOX/MYLANTA) 200-200-20 MG/5ML suspension 30 mL  30 mL Oral Q4H PRN Kerry HoughSpencer E Simon, PA-C      . citalopram (CELEXA) tablet 20 mg  20 mg Oral Daily Rockey SituFernando A Cobos, MD   20 mg at 06/17/14 0820  . hydrOXYzine (ATARAX/VISTARIL) tablet 25 mg  25 mg Oral Q6H PRN Kerry HoughSpencer E Simon, PA-C   25 mg at 06/14/14 1613  . levETIRAcetam (KEPPRA) tablet 250 mg  250 mg Oral Daily Craige CottaFernando A Cobos, MD   250 mg at 06/17/14 0820  . levETIRAcetam (KEPPRA) tablet 500 mg  500 mg Oral QHS Craige CottaFernando A Cobos, MD   500 mg at 06/16/14 2136  . levonorgestrel-ethinyl estradiol (SEASONALE,INTROVALE,JOLESSA) 0.15-0.03 MG per tablet 1 tablet  1 tablet Oral Daily Kerry HoughSpencer E Simon, PA-C   1 tablet at 06/17/14 [phone]  . magnesium hydroxide (MILK OF MAGNESIA) suspension 30 mL  30 mL Oral Daily PRN Kerry HoughSpencer E Simon, PA-C      . prazosin (MINIPRESS) capsule 1 mg  1 mg Oral QHS Craige CottaFernando A Cobos, MD   1 mg at 06/16/14 2139  . QUEtiapine (SEROQUEL) tablet 50 mg  50 mg Oral QHS Craige CottaFernando A Cobos, MD   50 mg at 06/16/14 2137  . traZODone (DESYREL) tablet 25 mg  25 mg Oral QHS,MR X 1 Craige CottaFernando A Cobos, MD   25 mg at 06/16/14 2301  . triamcinolone cream (KENALOG) 0.1 % 1 application  1 application Topical BID Kerry HoughSpencer E Simon, PA-C   1 application at 06/17/14 [id number]    Lab Results:  No results found for this or any previous visit (from the past 48 hour(s)).  Physical Findings: AIMS: Facial and Oral Movements Muscles of Facial Expression: None, normal Lips and Perioral Area: None, normal Jaw: None, normal Tongue: None, normal,Extremity Movements Upper (arms, wrists, hands, fingers): None, normal Lower (legs, knees, ankles,  toes): None, normal, Trunk Movements Neck, shoulders, hips: None, normal, Overall Severity Severity of abnormal movements (highest score from questions above): None, normal Incapacitation due to abnormal movements: None, normal Patient's awareness of abnormal movements (rate only patient's report): No Awareness, Dental Status Current problems with teeth and/or dentures?: No Does patient usually wear dentures?: No  CIWA:  CIWA-Ar Total: 3 COWS:      Assessment-  Patient is improving. Patient reports sleep and nightmares as improved. Patient continues to be homicidal  towards room mate.Will continue to observe.  Treatment Plan Summary: Daily contact with patient to assess and evaluate symptoms and progress in treatment, Medication management, Plan Continue inpatient psychiatric management and Medication management as below Seroquel 50 mgrs QHS Celexa 20 mgrs QDAY Continue Trazodone  25 mgrs QHS PRN insomnia Continue Minipress 1 mgr QHS to address nightmares . CSW will work on disposition.   Medical Decision Making:  Established Problem, Stable/Improving (1), Review of Psycho-Social Stressors (1), Review or order clinical lab tests (1), Review of Last Therapy Session (1), Review of Medication Regimen & Side Effects (2) and Review of New Medication or Change in Dosage (2)  Lucielle Vokes MD 06/17/2014, 3:31 PM

## 2014-06-18 ENCOUNTER — Other Ambulatory Visit (HOSPITAL_COMMUNITY): Payer: BLUE CROSS/BLUE SHIELD | Admitting: Licensed Clinical Social Worker

## 2014-06-18 ENCOUNTER — Telehealth (HOSPITAL_COMMUNITY): Payer: Self-pay | Admitting: Licensed Clinical Social Worker

## 2014-06-18 NOTE — [facility] Group Notes (Signed)
[facility] Group Notes:  (Nursing/MHT/Case Management/Adjunct)  Date:  06/18/2014  Time:  0900am  Type of Therapy:  Nurse Education  Participation Level:  Did Not Attend  Participation Quality:  Did not attend  Affect:  Did not attend  Cognitive:  Did not attend  Insight:  None  Engagement in Group:  Did not attend  Modes of Intervention:  Discussion, Education and Support  Summary of Progress/Problems: Patient invited to group. Pt did not attend and remained in bed resting.  Lendell CapriceGuthrie, Vickye Astorino A 06/18/2014, 10:15 AM

## 2014-06-18 NOTE — Plan of Care (Signed)
Problem: Ineffective individual coping Goal: STG: Patient will remain free from self harm Outcome: Progressing Patient remains free from self harm. 15 minute checks continued per protocol for patient safety.   Problem: Diagnosis: Increased Risk For Suicide Attempt Goal: STG-Patient Will Comply With Medication Regime Outcome: Progressing Patient has adhered to medication regimen with ease today.

## 2014-06-18 NOTE — Tx Team (Signed)
Interdisciplinary Treatment Plan Update   Date Reviewed:  06/18/2014  Time Reviewed:  9:13 AM  Progress in Treatment:   Attending groups: Yes, patient attend group. Participating in groups: Yes, patient participates in group Taking medication as prescribed: Yes  Tolerating medication: Yes Family/Significant other contact made:  Yes, collateral contact with mother. Patient understands diagnosis:.Yes, patient understands diagnosis and need for treatment. Discussing patient identified problems/goals with staff:Yes, patient is able to express goals for treatment and discharge. Medical problems stabilized or resolved: Yes Denies suicidal/homicidal ideation: Yes Patient has not harmed self or others: Yes  For review of initial/current patient goals, please see plan of care.  Estimated Length of Stay:  1 day  Reasons for Continued Hospitalization:  Anxiety Depression Medication stabilization   New Problems/Goals identified:    Discharge Plan or Barriers:   [facility]   Additional Comments:  Continue medication stabilization  Patient and CSW reviewed patient's identified goals and treatment plan.  Patient verbalized understanding and agreed to treatment plan.   Attendees:  Patient:  06/18/2014 9:13 AM   Signature:  Sallyanne HaversF. Cobos, MD 06/18/2014 9:13 AM  Signature: Geoffery LyonsIrving Lugo, MD 06/18/2014 9:13 AM  Signature:  Christie BeckersBrittney Guthrie, RN 06/18/2014 9:13 AM  Signature:  Melissa NoonKaren Shugart, RN 06/18/2014 9:13 AM  Signature:   06/18/2014 9:13 AM  Signature:  Juline PatchQuylle Naomy Esham, LCSW 06/18/2014 9:13 AM  Signature:  Belenda CruiseKristin Drinkard, LCSW-A 06/18/2014 9:13 AM  Signature:  Leisa LenzValerie Enoch, Care Coordinator [facility] 06/18/2014 9:13 AM  Signature:   Onnie BoerJennifer Clark, RN [facility] 06/18/2014 9:13 AM   06/18/2014  9:13 AM   06/18/2014  9:13 AM  Signature:   06/18/2014  9:13 AM    Scribe for Treatment Team:   Juline PatchQuylle Samil Mecham,  06/18/2014 9:13 AM

## 2014-06-18 NOTE — [facility] Group Notes (Signed)
[facility] LCSW Group Therapy      Feelings About Diagnosis 1:15 - 2:30 PM         06/18/2014    Type of Therapy:  Group Therapy  Participation Level:  Active  Participation Quality:  Appropriate  Affect:  Appropriate  Cognitive:  Alert and Appropriate  Insight:  Developing/Improving and Engaged  Engagement in Therapy:  Developing/Improving and Engaged  Modes of Intervention:  Discussion, Education, Exploration, Problem-Solving, Rapport Building, Support  Summary of Progress/Problems:  Patient actively participated in group. Patient discussed past and present diagnosis and the effects it has had on  life.  Patient talked about family and society being judgmental and the stigma associated with having a mental health diagnosis.  She shared she accepts and diagnosis and is pleased to know what is going on with her.  She shared her illness is motivation for her to do well and that it does not define who she is.  Wynn BankerHodnett, Raihan Kimmel Hairston 06/18/2014

## 2014-06-18 NOTE — Progress Notes (Signed)
Recreation Therapy Notes  Animal-Assisted Activity/Therapy (AAA/T) Program Checklist/Progress Notes Patient Eligibility Criteria Checklist & Daily Group note for Rec Tx Intervention  Date: 03.01.2016 Time: 2:45pm Location: [address] Dayroom   AAA/T Program Assumption of Risk Form signed by Patient/ or Parent Legal Guardian yes  Patient is free of allergies or sever asthma yes  Patient reports no fear of animals yes  Patient reports no history of cruelty to animals yes  Patient understands his/her participation is voluntary yes  Patient washes hands before animal contact yes  Patient washes hands after animal contact yes  Behavioral Response: Appropriate   Education: Hand Washing, Appropriate Animal Interaction   Education Outcome: Acknowledges education.   Clinical Observations/Feedback: Patient interacted appropriately with therapy dog, petting him appropriately.   Kage Willmann L Meyli Boice, LRT/CTRS  Arlayne Liggins L 06/18/2014 7:48 PM 

## 2014-06-18 NOTE — Progress Notes (Signed)
D: Patient is alert and oriented. Pt's mood and affect is depressed, animated at times, and blunted, pleasant upon interaction. Pt's eye contact is fair. Pt denies SI/HI and AVH. Pt did not attend morning RN group. Pt is attending afternoon groups. A: Encouragement/Support provided to pt. Scheduled medications administered per providers orders (See MAR). 15 minute checks continued per protocol for patient safety.  R: Patient cooperative and receptive to nursing interventions. Pt remains safe.
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
## 2014-06-19 DIAGNOSIS — F333 Major depressive disorder, recurrent, severe with psychotic symptoms: Secondary | ICD-10-CM | POA: Insufficient documentation

## 2014-06-19 MED ORDER — PRAZOSIN HCL 1 MG PO CAPS: 1.0000 mg | ORAL_CAPSULE | Freq: Every day | ORAL | Status: DC

## 2014-06-19 MED ORDER — QUETIAPINE FUMARATE 50 MG PO TABS: 50.0000 mg | ORAL_TABLET | Freq: Every day | ORAL | Status: DC

## 2014-06-19 MED ORDER — TRIAMCINOLONE ACETONIDE 0.1 % EX CREA: 1.0000 "application " | TOPICAL_CREAM | Freq: Three times a day (TID) | CUTANEOUS | Status: DC | PRN

## 2014-06-19 MED ORDER — CITALOPRAM HYDROBROMIDE 20 MG PO TABS: 20.0000 mg | ORAL_TABLET | Freq: Every day | ORAL | Status: DC

## 2014-06-19 MED ORDER — HYDROXYZINE HCL 25 MG PO TABS: 25.0000 mg | ORAL_TABLET | Freq: Four times a day (QID) | ORAL | Status: DC | PRN

## 2014-06-19 MED ORDER — LEVETIRACETAM 500 MG PO TABS: 500.0000 mg | ORAL_TABLET | Freq: Every day | ORAL | Status: DC

## 2014-06-19 MED ORDER — LEVETIRACETAM 250 MG PO TABS: 250.0000 mg | ORAL_TABLET | Freq: Every day | ORAL | Status: DC

## 2014-06-19 MED ORDER — TRAZODONE HCL 50 MG PO TABS: 25.0000 mg | ORAL_TABLET | Freq: Every evening | ORAL | Status: DC | PRN

## 2014-06-19 MED ORDER — LEVONORGEST-ETH ESTRAD 91-DAY 0.15-0.03 MG PO TABS: 1.0000 | ORAL_TABLET | Freq: Every day | ORAL | Status: DC

## 2014-06-19 MED ORDER — TRAZODONE HCL 50 MG PO TABS
25.0000 mg | ORAL_TABLET | Freq: Every evening | ORAL | Status: DC | PRN
  Filled 2014-06-19 (×2): qty 0.5
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
## 2014-06-19 NOTE — Progress Notes (Signed)
Patient ID: Sharon [name], female   DOB: May 04, 1995, 19 y.o.   MRN: [id number] D: Patient stated she is doing well. Pt reports decreased anxiety and depressive symptoms. Pt thought process is organized and behavior is childlike. Pt denies suicidal /homicidal ideation intent and plan. Pt denies auditory and visual hallucination. Pt attended evening wrap up group and engaged in discussion. Cooperative with assessment.   A: Met with pt 1:1. Medications administered as prescribed. Writer encouraged pt to discuss feelings. Pt encouraged to come to staff with any questions or concerns.   R: Patient is safe on the unit. She is complaint with medications and denies any adverse reaction.

## 2014-06-19 NOTE — Plan of Care (Signed)
Problem: Alteration in mood; excessive anxiety as evidenced by: Goal: STG-Pt will report an absence of self-harm thoughts/actions (Patient will report an absence of self-harm thoughts or actions)  Outcome: Progressing Pt is safe and denies suicidal ideation     
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
## 2014-06-19 NOTE — Progress Notes (Signed)
  [facility] :   Will you be returning to the same living situation after discharge:  Yes,  [name] is returning to her home. At discharge, do you have transportation home?: Yes,  Patient to arrange transportation home. Do you have the ability to pay for your medications: Yes,  Patient is able to obtain medications.  Release of information consent forms completed and in the chart;  Patient's signature needed at discharge.  Patient to Follow up at: Follow-up Information    Follow up with [facility]asm[facility] On 06/19/2014.   Why:  Walk-in appointment the day of discharge between 8 am to 5 pm to see [name]. Please call office if you have any questions.   Contact information:   [address] [place], [place]     [phone]      Follow up [facility].   Why:  You are scheduled to begin the Partial Hospitalization Program on Thursday,June 20, 2014 at [facility]9AM   Contact information:   [address] [place], [place]   [id number]  [phone]      Patient denies SI/HI: Patient no longer endorsing SI/HI or other thoughts of self harm.  Safety Planning and Suicide Prevention discussed: .Reviewed with all patients during discharge planning group   Have you used any form of tobacco in the last 30 days? (Cigarettes, Smokeless Tobacco, Cigars, and/or Pipes): No  Has patient been referred to the Quitline?:  No referral needed for Quitline.  Wynn BankerHodnett, Sharon [name] 06/19/2014, 10:18 AM

## 2014-06-19 NOTE — Progress Notes (Signed)
Discharge: Pt received both written and verbal discharge instructions. Pt verbalized understanding of discharge instructions. Pt denies SI at time of discharge. Pt agreed to f/u appt and med regimen. Pt received sample meds and prescriptions. Pt received belongings from room and locker. Pt safely left [facility].

## 2014-06-20 ENCOUNTER — Other Ambulatory Visit (HOSPITAL_COMMUNITY): Payer: BLUE CROSS/BLUE SHIELD | Attending: Psychiatry | Admitting: Licensed Clinical Social Worker

## 2014-06-20 ENCOUNTER — Encounter (HOSPITAL_COMMUNITY): Payer: Self-pay | Admitting: Licensed Clinical Social Worker

## 2014-06-20 DIAGNOSIS — F332 Major depressive disorder, recurrent severe without psychotic features: Secondary | ICD-10-CM | POA: Diagnosis present

## 2014-06-20 DIAGNOSIS — F419 Anxiety disorder, unspecified: Secondary | ICD-10-CM

## 2014-06-20 DIAGNOSIS — F4310 Post-traumatic stress disorder, unspecified: Secondary | ICD-10-CM | POA: Insufficient documentation
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
## 2014-06-21 ENCOUNTER — Encounter (HOSPITAL_COMMUNITY): Payer: Self-pay | Admitting: Psychiatry

## 2014-06-21 ENCOUNTER — Other Ambulatory Visit (HOSPITAL_COMMUNITY): Payer: BLUE CROSS/BLUE SHIELD | Admitting: Psychiatry

## 2014-06-21 VITALS — BP 109/73 | HR 92 | Ht 66.0 in | Wt 128.6 lb

## 2014-06-21 DIAGNOSIS — F332 Major depressive disorder, recurrent severe without psychotic features: Secondary | ICD-10-CM | POA: Diagnosis not present

## 2014-06-21 DIAGNOSIS — F419 Anxiety disorder, unspecified: Secondary | ICD-10-CM
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
## 2014-06-21 NOTE — Psych (Signed)
D. Patient presents with appropriate affect, level mood and denies any current thoughts of wanting to harm herself or others.  Patient reported she does have suicidal thoughts "most days" but denies at this time with no plan or intent.  Patient stated liking the Partial Hospitalization Program she started today and thinks it will be helpful per discussion.  Patient rated her depression at a 7, hopelessness at a 2 and anxiousness at a 10 on a scale of 0 being none and 10 being the highest she could rate.  A. Reviewed patient's medications as patient reported she had not been taking her hydroxyzine and was not sure what it was being prescribed to treat.  Patient agreed to try the hydroxyzine for as needed to assist with symptoms of anxiety.  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  Patient reported she is mostly tired all the time due to her busy schedule and acknowledges this has a lot to do with her symptoms of depression.  Patient reported one time past suicidal attempt by overdosing but stated she did not require inpatient services at that time and was not currently having thoughts to harm self.  R. Reviewed an emergency plan for patient to call or come [facility] at any time for an immediate evaluation if her periodic "fleathing thoughts" as she acknowledged increased or if she ever began planning out how she would harm herself or was afraid would act on past thoughts.  Patient agreed with plan at this time and contracted for safety.  Patient reported plan to return to services on Monday.  States does have a history of seizures but that these are currently managed by Kepra medications effectively.  Will see patient in the coming week for follow up of progress in PHP.  15 min.  Patient's PHQ9 score on depression rated as a 22 this date.

## 2014-06-21 NOTE — Psych (Signed)
[facility]  Sharon [name] [id number]  Session Time: 11:00 AM - 12:30 PM  Participation Level: Active  Behavioral Response: CasualAlertEuthymic  Type of Therapy: Group Therapy  Treatment Goals addressed: Anxiety, Coping and Diagnosis: MDD, Anxiety Disorder, unspecified,   Interventions: Supportive and Other: Coping  Summary: Sharon [name] is a 19 y.o. female who presents with euthymic mood but reports sad mood and racing thoughts. This is her first day of group. She says her priority to to work on coping skills to work on discipline and coping skills to work on routine. She could not identify source of sadness but discussed how hurt she was that her best friend was focused on herself and being selfish because patient had not contacted her post suicide attempt. Sharon [name] said that she always thinks about other's needs and this was the first time she needed her friends to focus on her needs. Sharon [name] received validation for her feelings through other group members who agreed that she had a right to feel hurt with their selfishness. Therapist pointed out that there might be underlying anger but Sharon [name] denied these feelings. Therapist pointed out that one of her bigger issues may be suppressing her needs for the sake of others and that may have led to making her emotional distress worse. At end of group, it seemed it appeared she was dissociating. When therapist called her name several times she said she was "day dreaming".  Sharon [name] needs to find ways to manage emotions before they escalate and she resorts to self-destructive behaviors.     Suicidal/Homicidal: No  Therapist Response: Therapist provided supportive interventions and explored sources of her sadness. Therapist pointed out Sharon [name]'s suppression of her emotions and needs that have led to worsening mental health symptoms.   Plan: 1.Therapist educate patient on healthier coping strategies to manage emotions.2.Sharon [name] implement  healthier coping strategies for emotions so that she does resort to behaviors that make the situation worse.   Session Time: 1:00 PM - 2:00  PM  Participation Level: Active  Behavioral Response: CasualAlertEuthymic  Type of Therapy: Psycho-Education Group  Treatment Goals addressed: Anxiety, Coping and Diagnosis: MDD, Anxiety Disorder, unspecified,   Interventions: DBT  Summary: Therapist introduced to patient concept of distress intolerance. Therapist explained that trying to escape painful emotions can make them worse. Sharon [name] recognizes distress escape methods such as avoidance. She said that one example is avoiding getting help. Therapist explained that distress tolerance are skills for tolerating and surviving crises and skills for accepting life as it is in the moment. It is a part of being effective and doing what works rather than resorting to self-destructive behaviors. Sharon [name] identified reading, walking and eating as distress tolerance skills she uses. Therapist will continue with distress tolerance model and teach Sharon [name] other distress tolerance strategies. These are important for her to learn as she can not continue to suppress that had led to a suicide attempt in the past.   Suicidal/Homicidal: No  Therapist Response: Therapist taught Sharon [name] model of Distress Tolerance. Therapist explained why avoiding painful emotions makes them worse and that an effective strategy is to learn skills to help experience, tolerate and accept pain as ways to reduce pain.   Plan: 1.Therapist educate patient on distress tolerance skills to help her manage and not suppress negative emotions.2.Patient implement distress tolerance skills as part of moving forward with treatment goals.   Diagnosis: Primary Diagnosis: Major depressive disorder, recurrent, severe without psychotic features [F33.2]    1. Major depressive  disorder, recurrent, severe without psychotic features   2. Anxiety disorder, unspecified anxiety  disorder type       Nehemiah Massed, MD 06/21/2014

## 2014-06-24 ENCOUNTER — Other Ambulatory Visit (HOSPITAL_COMMUNITY): Payer: BLUE CROSS/BLUE SHIELD | Admitting: Psychiatry

## 2014-06-24 DIAGNOSIS — F419 Anxiety disorder, unspecified: Secondary | ICD-10-CM

## 2014-06-24 DIAGNOSIS — F332 Major depressive disorder, recurrent severe without psychotic features: Secondary | ICD-10-CM | POA: Diagnosis not present

## 2014-06-24 NOTE — Psych (Signed)
   [facility]  Sharon [name] [id number]  Session Time: 11:00 AM - 12:30 PM  Participation Level: Active  Behavioral Response: CasualAlertEuthymic  Type of Therapy: Group Therapy  Treatment Goals addressed: Anxiety, Coping and Diagnosis: MDD, Unspecified Anxiety Disorder,   Interventions: CBT and Other: Coping  Summary: Sharon [name] is a 19 y.o. female who presents with today not being able to describe her level of depression. As we discussed more, she related only getting one hour of sleep, having nightmares and that she had anxiety symptoms of chest hurting and she described her anxiety as a feeling of excitement that she can't get rid of. She relates that Trazadone is not working, that she needs to get refills on medication and that her schedule has caused her days and nights to be mixed up. As we explored the underlying cause of her anxiety, she explained that she is stressed about school next week and being able to catch up. We discussed coping strategies she uses to manage anxiety such as writing, listening to music and relaxation exercises. Therapist discussed cognitive strategies to challenge negative automatic thoughts. There was feedback from another group member of healthy coping strategies she is using to manage feeling overwhelmed with school. Therapist encouraged patient to use her resources and to tell herself positive coping statements such as taking one thing at a time and I will do the best I can.    Suicidal/Homicidal: No  Therapist Response: Therapist encouraged Sharon [name] to use coping strategies that have been effective for her already to mange anxiety. Therapist utilized group therapy techniques to encourage other group members to offer their own strategies of how they manage situations that are overwhelming. Therapist informed Sharon [name] of CBT strategies and positive coping strategies to challenge negative thoughts.   Plan: 1.Sharon [name] will use effective coping  strategies including CBT to manage anxiety and stress.2.Therapist will encourage Sharon [name] to use effective coping strategies she is already using as well as helping her learn new coping strategies that will be effective for her.   Session Time: 1:00 PM - 2:00 PM  Participation Level: Active  Behavioral Response: CasualAlertEuthymic  Type of Therapy: Psycho-education Group  Treatment Goals addressed: Anxiety, Coping and Diagnosis: MDD, Unspecified Anxiety Disorder,   Interventions: DBT  Summary: Therapist presented worksheet on "Accepting Distress" and therapist introduced mindfulness concept to Sharon [name] as a distress tolerance skill for management of uncomfortable emotions. Therapist explained that you can immerse yourself in an emotional experience without attaching to it and being carried away by them. You can observe your emotions but allow them to pass. Sharon [name] participated in discussion on mindfulness and thought about how she would apply it specifically to her PTSD symptoms.   Suicidal/Homicidal: No  Therapist Response: Therapist introduced mindfulness as a Distress Tolerance Skill.   Plan: 1.Sharon [name] apply the skill of mindfulness to manage stress and negative emotions.2.Therapist continue in educating Sharon [name] on module of Distress Tolerance.    Diagnosis: Primary Diagnosis: Major depressive disorder, recurrent, severe without psychotic features [F33.2]    1. Major depressive disorder, recurrent, severe without psychotic features   2. Anxiety disorder, unspecified anxiety disorder type       Bowman,[name] 06/24/2014

## 2014-06-24 NOTE — Progress Notes (Signed)
Patient Discharge Instructions:  After Visit Summary (AVS):   Faxed to:  06/24/14 Discharge Summary Note:   Faxed to:  06/24/14 Psychiatric Admission Assessment Note:   Faxed to:  06/24/14 Suicide Risk Assessment - Discharge Assessment:   Faxed to:  06/24/14 Faxed/Sent to the Next Level Care provider:  06/24/14 Next Level Care Provider Has Access to the EMR, 06/24/14  [facility] @ [phone] [facility] via [facility]/Epic access.  Jerelene ReddenSheena E Melville, 06/24/2014, 1:29 PM

## 2014-06-24 NOTE — Progress Notes (Signed)
   THERAPIST PROGRESS NOTE  Session Time: 2:00-3:00  Participation Level: Active  Behavioral Response: CasualAlertEuthymic  Type of Therapy: Group Therapy  Treatment Goals addressed: Anxiety  Interventions: CBT; distress tolerance  Summary: Sharon [name] is a 19 y.o. female who presents with anxiety.   Suicidal/Homicidal: Nowithout intent/plan  Therapist Response: Pt. Presented with euthymic mood, talkative, responsive to questions from group leader. Pt. Described her anxiety as "anticipation", "excitement". Pt. Reported experiencing symptoms such as shortness of breath and that it often feels sporadic. Pt. Participated in instruction about use of meditation as a distress tolerance tool. Pt. Was instructed on model of thinking of thought, feelings, and physical sensations as floats in a parade that she can develop relationship to as a nonjudgmental observer.   Plan: 06/25/2014.  Diagnosis: Axis I: Mood Disorder NOS    Axis II: No diagnosis    Shaune PollackBrown, Jennifer B, [facility] 06/24/2014

## 2014-06-24 NOTE — Progress Notes (Signed)
   THERAPIST PROGRESS NOTE  Session Time: 3:00-4:00  Participation Level: Active  Behavioral Response: CasualAlertEuthymic  Type of Therapy: Group Therapy  Treatment Goals addressed: Anxiety  Interventions: CBT  Summary: Izell CarolinaKimberly Chahal is a 19 y.o. female who presents with anxiety.   Suicidal/Homicidal: Nowithout intent/plan  Therapist Response: Pt. Watched Amy Cuddy video on power poses and participated in discussion about how power poses can be used to counter stress in social situations. Pt. Discussed that school and her roommate are current stressors. Pt. Was able to identify that changing her roommate is an environmental stressor that she has some control over and has a plan for changing. Pt. Participated in bilateral tapping exercise to use during episodes of PTSD.   Plan: Return again 06/25/2014.  Diagnosis: Axis I: Mood Disorder NOS    Axis II: No diagnosis    Shaune PollackBrown, Britlee Skolnik B, [facility] 06/24/2014

## 2014-06-25 ENCOUNTER — Ambulatory Visit (HOSPITAL_COMMUNITY): Payer: BLUE CROSS/BLUE SHIELD | Admitting: Licensed Clinical Social Worker

## 2014-06-25 ENCOUNTER — Ambulatory Visit (HOSPITAL_COMMUNITY): Payer: Self-pay | Admitting: Licensed Clinical Social Worker

## 2014-06-25 DIAGNOSIS — F419 Anxiety disorder, unspecified: Secondary | ICD-10-CM

## 2014-06-25 DIAGNOSIS — F332 Major depressive disorder, recurrent severe without psychotic features: Secondary | ICD-10-CM | POA: Diagnosis not present

## 2014-06-25 NOTE — Psych (Signed)
   [facility]  Sharon CarolinaKimberly Flye [id number]  Session Time: 2:00-3:00  Participation Level: Active  Behavioral Response: Casual and GuardedAlertAnxious and Depressed  Type of Therapy: Group Therapy  Treatment Goals addressed: Anxiety and Coping  Interventions: CBT, Solution Focused, Assertiveness Training and Social Skills Training  Summary: Sharon [name] is a 19 y.o. female who presents with feelings of sadness and overall "feeling down." States she does not know why she feels this way, she just does.  Sharon BradfordKimberly reports she has been feeling this way for the past few days and describes it as "being in a funk."   Suicidal/Homicidal: Negativewithout intent/plan  Therapist Response:  Therapist assessed pt sleep, eating and activity patterns and offered feedback where needed. Pt symptoms were accessed through The Beck Depression Inventory-II. This was used to assess the client's depression and suicide risk.The results of the testing indicated moderate concerns related to the pt's depression and suicide risk and this was reflected to her. She was offered coping skill techniques help discover more pleasurable activities.    Plan:  (1) [facility] (2) Pt will discuss and plan for a family therapy session (3) Pt will meet with Dr.Cabos for follow-up (4) Pt will continue to take all medications as prescribed (5) Pt will complete all assignments before returning to PHP   Diagnosis: No Primary Diagnosis found   No diagnosis found.    Sharon Maxim, LCSW 06/21/14

## 2014-06-25 NOTE — Psych (Signed)
[facility] PHP THERAPIST PROGRESS NOTE  Sharon Sharon [name] [id number]  Session Time: 11:30 AM- 12:30 PM  Participation Level: Active  Behavioral Response: CasualAlertEuthymic  Type of Therapy: Group Therapy  Treatment Goals addressed: Anxiety, Coping and Diagnosis: MDD, Anxiety Disorder, Unspecified   Interventions: Other: Coping  Summary:  Patient presented as euthymic but more awake in group. Therapist discussed elements of [name] healthy relationship involve [name] relationship in which both people's needs are respected. Also, therapist emphasized the importance of making sure one's owns needs are taken care of because you Sharon [name] not be able to take care of somebody else's needs and that it is important so that she Sharon [name] not be back here again. Therapist pointed out that she feels that Sharon Sharon [name] needs to do this with herself so that she Sharon [name] not get overwhelmed again and it also indicates that Sharon Sharon [name] needs to recognize that there is work to do on self-esteem. Sharon Sharon [name] relates that "I do not have any needs". She laid on the couch and she acknowledged that "I don't want to think about it". She agrees that she avoids and therapist related that she also deflects so that therapist Sharon [name] not pursue further. Therapist pointed out that avoidance and deflection are not effective ways to work on her issues.  Suicidal/Homicidal: No  Therapist Response: Therapist discussed with the group some qualities of [name] healthy relationship and tied this to self-esteem. Therapist pointed out Sharon [name]'s defenses that are not helping her to work on herself and her issues.   Plan: 1.Therapist build insight with Sharon Sharon [name] on need to work on self-esteem and taking care of her needs and help her to see that her defensives are not helping her. 2.Sharon [name] start to implement healthier strategies to work on self-esteem and taking care of her needs.   Session Time: 1:00 PM - 2:00 PM  Participation Level: Active  Behavioral Response: CasualAlertEuthymic  Type of  Therapy: Group Therapy  Treatment Goals addressed: Anxiety, Coping and Diagnosis: MDD, Anxiety Disorder, Unspecified   Interventions: Other: Coping  Summary: Therapist worked with patient on healthier ways to manage anxiety. Therapist identified other CBT strategies that are more helpful including problem solving, overestimating the likelihood that bad things Sharon [name] happen, and underestimating her ability to deal with things if they don't go well. Sharon Sharon [name] says that she is already overwhelmed and using avoidance to manage her anxiety. Therapist needs to continue to work with her on CBT and other coping strategies to manage stressors that Sharon [name] be more effective in helping her manage her stressors. She went to the hospital because she was overwhelmed and feeling helpless and it seems that she is getting right back into that situation. She needs to address her mental health including better coping strategies so she Sharon [name] not return to escalation of symptoms.   Suicidal/Homicidal: No  Therapist Response: Therapist discussed CBT strategies to challenge anxiety producing thoughts and help Sharon Sharon [name] to manage stressors. Therapist is working on building insight for Sharon Sharon [name] that better coping strategies is [name] priority so she does decompensate.   Plan: 1.Therapist teach Sharon [name] CBT strategies to challenge anxiety producing thoughts so she is more able to utilize outside of session.2.Therapist raise insight for Sharon Sharon [name] of the need to prioritize work on effective coping strategies 3.Sharon [name] utilize CBT strategies for unhealthy thoughts that impact anxiety     Diagnosis: Primary Diagnosis: Major depressive disorder, recurrent, severe without psychotic features [F33.2]    1. Major depressive disorder, recurrent, severe without psychotic features   2. Anxiety disorder, unspecified  anxiety disorder type       Sharon Sharon [name] 06/25/2014

## 2014-06-25 NOTE — Psych (Signed)
06/25/14  [facility]-  Medication Management Note.  Duration-  25 Minutes  Dx- MDD and PTSD   Subjective :  Patient states she is feeling partially better. She states she has benefited from treatment. She does continue to report some interrupted sleep, waking up anxious and sweating at times , and some anxiety.  Objective : At this time she presents improved compared to her admission status. She is presenting with an improved range of affect, and she states she is functioning better in her everyday activities. For example, is currently in the process of finding alternative housing in order to address the stressor of poor relationship with current roommate. She has also taken time off one of her two jobs, in order to focus more on herself and on treatment. 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  As discussed with treatment team , she has been attending groups regularly and seems motivated in this level of care She does have some ongoing PTSD symptoms, to include some avoidance, and also some ongoing nightmares and episodes of waking up. In general , anxiety symptoms seem to be more persistent than mood symptoms at this time Of note, patient has a history of seizure disorder which started during adolescence. She has not had a seizure in a number of weeks, and is doing well on Keppra, which is prescribed by her PCP.  MSE- at this time she is alert, attentive, well related, pleasant, calm, cooperative, mood is improved compared to prior presentation, and her affect seems fuller in range. No thought disorder, no SI , no HI, no psychotic symptoms ( hallucinations she had been experiencing prior to admission have resolved.) At this tim e does not appear internally preoccupied.   Current Medications:  Celexa 20 mgrs QAM, Minipress 1 mgr QHS, Seroquel 50 mgrsd QHS, Trazodone 25 mgrs QHS PRN,  Keppra 500 mgrs QAM and 250 mgrs QHS  Assessment: At this time patient is improved compared to admission status. She is presenting with an improved mood, range of affect, and PTSD symptoms, although chronic, are less severe. She is tolerating medications well . We discussed medication adjustments, but at this time will continue current doses as she is tolerating them well.  Plan: Continue medications as above. At this time does not need any new medications. Continue PHP level of care, which she is currently benefiting from.     Nehemiah MassedFernando Cobos,  MD

## 2014-06-25 NOTE — Psych (Signed)
[facility]  Sharon [name] [id number]  Session Time: 2:00-3:00  Participation Level: Minimal  Behavioral Response: GuardedConfusedDepressed  Type of Therapy: Group Therapy  Treatment Goals addressed: Anxiety and Coping  Interventions: CBT, Solution Focused, Strength-based and Reframing  Summary: Sharon [name] is a 19 y.o. female who presents with symptoms of depression such as: sadness, lethargy, irritability and inability to focus.  Sharon [name] stated that she was unable to focus on the CBT exercise and stated that she had difficulty understanding because she was not paying attention. Sharon [name] states that she often is unable to get a full nights rest and feels that her living situation on campus is contributing to much of her ongoing stress and lack of enjoyment with activities.    Suicidal/Homicidal: Negativewithout intent/plan  Therapist Response: Behavioral techniques were used to teach communication skills. Communication skills such as offering positive feedback, active listening, making positive requests for behavioral change, and giving negative feedback in an honest, respectful manner were taught. Behavioral techniques were used to teach healthy communication skills. Education modeling, role-playing, and corrective feedback and positive reinforcement were used to teach communication skills. Pt was asked to developed a list of conflicts that could be addressed with CBT problem-solving techniques.   Plan:  (1) [facility] (2) Pt will discuss and plan for a family therapy session (3) Pt will meet with Dr.Cabos for follow-up (4) Pt will continue to take all medications as prescribed (5) Pt will complete all assignments before returning to PHP   Diagnosis: No Primary Diagnosis found   No diagnosis found.    Vallery Ridgeyler,Tempest Frankland, LCSW 06/25/2014                   [facility]  Sharon CarolinaKimberly  [name] [id number]  Session Time: 3:00-4:00  Participation Level: Minimal  Behavioral Response: GuardedConfusedDepressed  Type of Therapy: Group Therapy  Treatment Goals addressed: Anxiety and Coping  Interventions: CBT, Solution Focused, Strength-based and Reframing  Summary: Sharon [name] is a 19 y.o. female who presents with improved mood. Sharon [name] states she realizes she has to make some changes in her situation at home but is afraid that she will not be able to get rid of enough stress in her life to be able to function properly.  She was able to discuss several area of her life within the group of which she has concerns about her ability to manage effectively.   Suicidal/Homicidal: Negativewithout intent/plan  Therapist Response: Behavioral techniques such as education, modeling, role-playing, corrective feedback, and positive reinforcement were used to teach problem-solving skills. Specific problem-solving skills were taught including defining the problem constructively and specifically, brainstorming options, evaluating options, choosing options, implementing a plan, evaluating the results, and reevaluating the plan. Pt was asked to identify conflicts that can be addressed through problem-solving techniques. They were asked to give input about conflicts that could be addressed with problem-solving techniques and shown how to develop solutions through CBT.Pt was asked to use the problem-solving skills on specific situations. Pt was reinforced for positive use of problem-solving skills.  Plan:  (1) [facility] (2) Pt will discuss and plan for a family therapy session (3) Pt will meet with Dr.Cabos for follow-up (4) Pt will continue to take all medications as prescribed (5) Pt will complete all assignments before returning to PHP   Diagnosis: No Primary Diagnosis found   No diagnosis found.    Chaitanya Amedee, LCSW 06/25/2014

## 2014-06-26 ENCOUNTER — Other Ambulatory Visit (HOSPITAL_COMMUNITY): Payer: BLUE CROSS/BLUE SHIELD | Admitting: Licensed Clinical Social Worker

## 2014-06-26 DIAGNOSIS — F332 Major depressive disorder, recurrent severe without psychotic features: Secondary | ICD-10-CM | POA: Diagnosis not present

## 2014-06-26 DIAGNOSIS — F419 Anxiety disorder, unspecified: Secondary | ICD-10-CM

## 2014-06-26 NOTE — Psych (Signed)
[facility]  Sharon [name] [id number]  Session Time: 11:00 AM - 12:30 PM  Participation Level: Active  Behavioral Response: CasualDrowsy and LethargicEuthymic  Type of Therapy: Group Therapy  Treatment Goals addressed: Anxiety, Coping and Diagnosis: MDD, Anxiety Disorder, Unpecified   Interventions: Other: Coping  Summary: Sharon [name] is a 19 y.o. female who presents with inability to sleep for the past 4-5 days and nausea. She takes short naps and then has nightmares. She reports racing thoughts even though she reports no reason to be anxious, and she reports a moderate amount of anxiety and depression. She relates that the medications are not working for sleep. Therapist attempted to present worksheet on anxiety and coping with stress but it was difficult to keep difficult to keep Sharon [name] focused on topics and was easily distracted due to her lack of sleep. We pointed out the positive steps she is taking to manage stress such as narrowing down her choices of apartments and choosing to take time off from school but it does not seem as she is processing very well given her lack of sleep. Therapist discussed the importance of sleep to one's physical and mental health and that it is an important issue to address in treatment. We reviewed how relaxation can be an effective way to wind down and help with sleep. We reviewed the cause of lack of sleep and Sharon [name] is unsure whether it is related to anxiety or to manic type symptoms. It is important that she engages in sleep hygiene and that we work on better sleep in treatment as she is not able to get the full benefits of sleep or function with lack of sleep.    Suicidal/Homicidal: No  Therapist Response: Therapist presented topic through a worksheet on effective management of stress. Therapist explored the underlying cause of Sharon [name]'s sleep and it is unclear at this point whether it is related to anxiety or manic type symptoms.  Therapist discussed the importance of sleep to health and reviewed strategies for sleep hygiene.   Plan: 1.Therapist address with Sharon [name] issues of sleep so she will be able to function more effectively and to help improve mood.2.Sharon [name] take proactive steps by implementing strategies to help with sleep.  Session Time: 1:00 PM - 2:00 PM  Participation Level: Active  Behavioral Response: CasualDrowsy and LethargicEuthymic  Type of Therapy: Group Therapy  Treatment Goals addressed: Anxiety, Coping and Diagnosis: MDD, Anxiety Disorder, Unpecified,   Interventions: Therapist introduced information on PTSD & Memory and gave Sharon [name] information on how PTSD treatment involves helping a patient find the proper place for them to store their memories. Sharon [name] reports symptoms of PTSD including severe flashbacks, nightmares, hyper-vigilence but she explains that she is not ready to deal with trauma symptoms. Therapist explained that even though she does not want to deal with them through her conscious that the trauma is manifesting themselves in her subconscious and unconscious mind and her mind is still wanting to deal with it at this level. The symptoms are surfacing when her mind has less conscious control. Therapist explained that her symptoms keep surfacing because her PTSD memories are not stored properly. It appears that avoidance is a coping strategy for Sharon [name] that have implications beyond PTSD. I think this strategy of avoidance is applied to many other issues and this needs to be worked on in treatment so that Sharon [name] can make progress.   Suicidal/Homicidal: No  Therapist Response: Therapist educated client about PTSD & Memory and explained  how it relates to reoccurrence of her symptoms. Therapist identified avoidance as a coping strategy that needs to be addressed so that Sharon [name] can make progress in treatment.   Plan: 1.Therapist help raise insight as to how Sharon [name] needs a healthier coping strategy than avoidance and help  her learn more effective coping strategies. 2.Sharon [name] apply healthier coping strategies to manage mental health symptoms.   Diagnosis: Primary Diagnosis: Major depressive disorder, recurrent, severe without psychotic features [F33.2]    1. Major depressive disorder, recurrent, severe without psychotic features   2. Anxiety disorder, unspecified anxiety disorder type       Bowman,[name] 06/26/2014

## 2014-06-27 ENCOUNTER — Ambulatory Visit (HOSPITAL_COMMUNITY): Payer: Self-pay | Admitting: Licensed Clinical Social Worker

## 2014-06-27 ENCOUNTER — Ambulatory Visit (HOSPITAL_COMMUNITY): Payer: BLUE CROSS/BLUE SHIELD | Admitting: Licensed Clinical Social Worker

## 2014-06-27 ENCOUNTER — Other Ambulatory Visit (HOSPITAL_COMMUNITY): Payer: BLUE CROSS/BLUE SHIELD

## 2014-06-27 DIAGNOSIS — F419 Anxiety disorder, unspecified: Secondary | ICD-10-CM

## 2014-06-27 DIAGNOSIS — F332 Major depressive disorder, recurrent severe without psychotic features: Secondary | ICD-10-CM

## 2014-06-27 NOTE — Psych (Signed)
[facility] BH PHP THERAPIST PROGRESS NOTE  Phillippa Straub [id number]  Session Time: 11:00 - 12:30 PM  Participation Level: Active  Behavioral Response: CasualAlertEuthymic  Type of Therapy: Group Therapy  Treatment Goals addressed: Anxiety, Coping and Diagnosis: MDD, Anxiety Disorder Unspecified  Interventions: Supportive and Other: Coping-Managing Stresors, Psychodynamic-processing feelings and insight building   Summary: Sharon [name] is [name] 19 y.o. female who reports only having two hours [name] sleep last night. She looks more rested so it is unclear how accurate she is as [name] historian. She appears to be addressing stressors as she has found an apartment and she agrees she needs to take [name] medical leave to ensure she is stable with her mental health so she is better able to handle her stressors. She utilized the group to process feelings related to her frustrations including stressors and her friendship.She was given supportive feedback which indicates to this therapist that the group is supportive, [name] place that helps her gain insight, helps in managing stressors and processing feelings. My impression is that discussion and feedback help relieve her anxiety around stressors.I think though that [name] is guarded about opening up and diverts group way from issues where she feels too vulnerable. This impedes her progress and therapist needs to work on therapeutic relationship so [name] will feel more comfortable opening up.  Suicidal/Homicidal: No  Therapist Response: Therapist provided supportive feedback and feedback to help her in processing her emotions and as [name] coping strategy to manage stressors. Therapist also is providing feedback to help [name] in problem solving to help in managing stressors that will help improve her mental health. Therapist is working on building [name] therapeutic relationship so [name] will feel more comfortable opening up.   Plan: 1. [name] continue to take positive steps to manage  stressors that will help alleviate her mental health.2.[name] utilize the group to process feelings, to open up and to gain insight about herself to make changes.    Session Time: 1:00 PM - 2:00 PM  Participation Level: Active  Behavioral Response: CasualAlertEuthymic  Type of Therapy: Group Therapy  Treatment Goals addressed: Anxiety, Coping and Diagnosis: MDD, Anxiety Disorder Unspecified  Interventions: Supportive and Other: CBT, Psychodynamic-processing feelings and insight building   Summary: Therapist presented worksheet on "Behavior Experiments Negative Predictions"  This worksheet helps [name] see how negative behaviors to manage anxiety are not helpful. She said she learned from the worksheet that it is not helpful to avoid, be positive and to challenge your thoughts by being an objective observer who examines the evidence for and against one's thoughts. This is [name] helpful coping strategy for [name] who is having problems with anxiety and avoiding. I do not see her motivation at this point to apply these strategies outside of group so therapist needs to work on motivational strategies so she is applying coping skills to life situations.    Suicidal/Homicidal: No  Therapist Response: Therapist introduced CBT worksheet to help [name] in changing unhelpful behaviors of avoiding and learning [name] strategy to challenge cognitive distortions. Group Psychotherapy is also being utilized for [name] to be able to relate to others, gain trust, support and be [name] place where she can open up to work on her issues.    Plan: 1.[name] utilize CBT strategies outside of group to help her manage anxious situations.2.Therapist engage client and utilize motivational strategies so she utilizes coping strategies outside of group.   Summary:Diagnosis: Primary Diagnosis: Major depressive disorder, recurrent, severe without psychotic features [F33.2]    1.  Major depressive disorder, recurrent, severe without psychotic features    2. Anxiety disorder, unspecified anxiety disorder type       SharonMary [name] 06/27/2014
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
## 2014-06-28 ENCOUNTER — Other Ambulatory Visit (HOSPITAL_COMMUNITY): Payer: BLUE CROSS/BLUE SHIELD | Admitting: Licensed Clinical Social Worker

## 2014-06-28 ENCOUNTER — Ambulatory Visit (HOSPITAL_COMMUNITY): Payer: Self-pay | Admitting: Licensed Clinical Social Worker

## 2014-06-28 VITALS — BP 104/72 | HR 92 | Ht 66.0 in | Wt 126.0 lb

## 2014-06-28 DIAGNOSIS — F332 Major depressive disorder, recurrent severe without psychotic features: Secondary | ICD-10-CM

## 2014-06-28 DIAGNOSIS — F419 Anxiety disorder, unspecified: Secondary | ICD-10-CM

## 2014-06-28 NOTE — Psych (Signed)
[facility]  Sharon [name] [id number]  Session Time: 11:00 AM - 12:30 PM  Participation Level: Active  Behavioral Response: CasualAlertEuthymic  Type of Therapy: Group Therapy  Treatment Goals addressed: Anxiety, Coping and Diagnosis: MDD, Anxiety Disorder Unspecified  Interventions: Motivational Interviewing and Other: Coping  Summary: Sharon [name] is [name] 19 y.o. female who presents as engaged in group discussion but distracted by her cell phone at times. She shared with the group that she was burdened by her stressors that led to suicide attempt. She explained that the attempt was [name] way to get rest from her stressors. Therapist pointed out the extreme amount of stressors on her plate and she agreed that she did not have time to even sleep because of [name] job and school. The plan is for her to cut back on stressors until her mental health is better so she does not go back to the same thing. Even though this shows insight to [name] good coping strategy, she reports to the group that she has been off her medications for [name] week. She is not going to improve unless she invests more in to action steps in her treatment. When confronted, Sharon [name] avoids focusing on the topic. She is at risk for destabilization and deregulation of mood without having therapy in conjunction with her medications. Therapist worked on building insight and motivational strategies to work with her defenses and to help her to see the benefits of compliance.   Suicidal/Homicidal: No  Therapist Response: Therapist identified relief of Sharon [name]'s stressors as central to her treatment plan until she is able to stabilize with her mental health. Therapist expressed concerns with Sharon [name]'s ability to stabilize without medication compliance and using therapeutic strategies to work through her defense of avoidance.   Plan: 1.Therapist work with patient on gaining insight to the need to implement effective coping  strategies.2.Therapist utilize interventions to help work through patient's resistance to implementing effective coping strategies.   Session Time: 1:00 PM - 2:00 PM  Participation Level: Active  Behavioral Response: CasualAlertEuthymic  Type of Therapy: Group Therapy  Treatment Goals addressed: Anxiety, Coping and Diagnosis: MDD, Anxiety Disorder Unspecified  Interventions: Psycho Education-Shame and Vulnerability, Coping  Summary: Sharon [name] watched and discussed video by Altamease OilerBene Brown on Vulnerability and Shame. The video discussed how empathy helps with Shame but how she is unable to experience empathy to help with shame. When therapist attempted to explore further she utilized distraction and avoidance to explore further. I think that this may be self protective for her and signs that she is not ready to explore trauma in depth. I do think that the video was helpful for her with avoidance in explaining how vulnerability is an act of courage. This encourages her to see that being in vulnerable in therapy is courageous and will help her to grow.   Suicidal/Homicidal: No  Therapist Response: Therapist presented video of Altamease OilerBene Brown on Vulnerability and Shame. Therapist pointed out through message of video that vulnerability in therapy is courageous and helps [name] person to change.   Plan: 1.Therapist help raise insight to patient that actively applying effective coping strategies will help with anxiety and stressors.2.Sharon [name] proactively apply her coping strategies to stressors in her life.   Diagnosis: Primary Diagnosis: Major depressive disorder, recurrent, severe without psychotic features [F33.2]    1. Major depressive disorder, recurrent, severe without psychotic features   2. Anxiety disorder, unspecified anxiety disorder type       Sharon [name] 06/28/2014
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
## 2014-06-28 NOTE — Psych (Signed)
D. Patient presented with appropriate affect, level mood and stated she was in a rush to catch her but home today.  Patient admitted she had been almost a week without Seroquel and a few days without Trazodone medication due [facility] [employer] [facility].  Patient reported taking all other medications and denied any current suicidal or homicidal ideations.  Patient reported no problems with PHP and admitted to feeling some better at this time even though without some of her medication.  A. Patient scored a 14 on her PHQ9 Depression scale today, down from 22 from the previous week and reported a 5 on depression, 0 on hopelessness and an 8 on anxiety at this time out of a 0-10 scale with 0 being none at all to 10 being the highest they could experience.  R. Patient denied any current suicidal or homicidal ideations and agreed to get her medications filled and to restart taking all medications as soon as possible.  Patient reported still taking most medication but agreed she needed to take all prescribed as currently ordered.  Discussed side effects to medications and why they should be taken daily to be most effective and patient agreed with plan.  Patient to call as needed and will see again in the coming week.

## 2014-07-01 ENCOUNTER — Other Ambulatory Visit (HOSPITAL_COMMUNITY): Payer: BLUE CROSS/BLUE SHIELD | Admitting: Psychiatry

## 2014-07-01 ENCOUNTER — Ambulatory Visit (HOSPITAL_COMMUNITY): Payer: Self-pay | Admitting: Licensed Clinical Social Worker

## 2014-07-01 DIAGNOSIS — F419 Anxiety disorder, unspecified: Secondary | ICD-10-CM

## 2014-07-01 DIAGNOSIS — F332 Major depressive disorder, recurrent severe without psychotic features: Secondary | ICD-10-CM

## 2014-07-01 NOTE — Psych (Signed)
[facility] THERAPIST [facility]GRESS [facility]  Sharon [name] [id number]  Session Time: 11:00 AM - 12:30 PM  Participation Level: Active  Behavioral Response: CasualAlertEuthymic  Type of Therapy: Group Therapy  Treatment Goals addressed: Anxiety, Coping and Diagnosis: MDD, Anxiety Disorder, Unspecified  Interventions: Supportive and Other: Coping  Summary: Sharon [name] is [name] 19 y.o. female who presents today relating she is nauseous again. She said that she worked and is tired. She is more alert in group. She said that [name] positive coping strategy she has used is to breath in and out and say something positive. Therapist gave her positive feedback about this and said this is [name] good strategy as it can have [name] significant impact on changing the energy and dynamics to help make [name] situation more positive. Sharon [name] relates, however, that she has not taken her medications in [name] week. It appears to be related to transportation and getting to [name] place to her meds filled. This is concerning in terms of her ability to make progress in treatment without stabilizing on her medications. There was discussion in group about managing the stresses of school and other group members could relate to her situation of getting behind because of hospitalization and mental health symptoms. There was [name] discussion on problem solving, having [name] perspective that this is only small part of your life, recognizing the need to be proactive and using your resources. I am concerned though because Sharon [name] does not seem to be proactive and when encouraged by therapist to meet with her to contact her therapist she is resistant as her stress level increases. I notice frequent mood shifts in group although she does not act out. Therapist will continue to engage Sharon [name] in supportive interventions to help her in problem solving and taking more active steps to work on stressors and treatment goals.     Suicidal/Homicidal: No  Therapist Response: Therapist  discussed different strategies to address stress of getting behind at school including problem solving, having perspective on the significance of the stressor, being proactive and using resources. Therapist will continue to utilize supportive strategies and interventions to help Sharon [name] gain insight to effectiveness of using helpful coping strategies to help her in moving forward with treatment goals.  Plan: 1.Therapist work with patient's resistance to taking positive steps toward treatment goals through motivational strategies and strategies to build therapeutic relationship.2.Sharon [name] utilize helpful coping strategies to address her stressors.   Session Time: 1:00 PM - 2:00 PM  Participation Level: Minimal  Behavioral Response: CasualAlertDrowsy  Type of Therapy: Group Therapy  Treatment Goals addressed: Anxiety, Coping and Diagnosis: MDD, Anxiety Disorder, Unspecified  Interventions: DBT  Summary: Therapist reviewed mindfulness skills with group. Therapist explained how it helps us not get sucked into the chaotic vortex of our emotions and to remain [name] distance. Therapist also discussed how emotions and thoughts are temporary. I reviewed with the group also that once you have fully watched and experienced the negative emotion, you can then direction your attention to the present moment and utilize grounding skills. Sharon [name] was not able to remain attentive through group, however, and fell asleep. She was not actively participating which gives me the impression that she is not benefiting from this discussion on mindfulness. Motivational interventions will be continued to increase Sharon [name]'s motivation to use strategies such as mindfulness.  Suicidal/Homicidal: No  Therapist Response: Therapist reviewed mindfulness as [name] skill to help Sharon [name] detach from emotions in order to better manage them.  Plan: 1.Therapist raise Sharon [name]'s insight as  to usefulness of effective coping strategies to manage mood and stressors. 2.Sharon [name]  apply effective coping strategies in life situation.    Diagnosis: Primary Diagnosis: Major depressive disorder, recurrent, severe without psychotic features [F33.2]    1. Major depressive disorder, recurrent, severe without psychotic features   2. Anxiety disorder, unspecified anxiety disorder type       Sharon [name] 07/01/2014

## 2014-07-01 NOTE — Psych (Signed)
   [facility]  Sharon [name] [id number]  Session Time: 2:00-4:00  Participation Level: Minimal  Behavioral Response: Negative and GuardedAlertAnxious, Depressed and Irritable  Type of Therapy: Group Therapy  Treatment Goals addressed: Anxiety, Communication: Porblem Solving and Coping  Interventions: CBT, Motivational Interviewing, Solution Focused and Supportive  Summary: Sharon [name] is a 19 y.o. female who presents with inability to experience the full range of emotions, including forgiveness, acceptance and love demonstrated by others. Sharon [name] stated that she does not feel as if she can allow others to care for her. She states that she feels guilty asking for help from other people. She acknowledges that she does more for others than she expects them to do for her. She states that this makes her feel  "sad and depressed most of the time." but she doesn't know how to change it.   Suicidal/Homicidal: Negativewithout intent/plan  Therapist Response: Therapist taught Sharon [name] strategies such as relaxation, breathing control, covert modeling (e.g., imagining the successful use of the strategies) and/or role-playing for managing fears. Therapist assigned a homework exercise in which Sharon [name] will identify fearful self-talk and create reality-based alternatives which in crease her self-esteem and teach her to accept praise from others without fear of guilt and/or rejection.  Plan:  (1) [facility] (2) Pt will discuss and plan for a family therapy session (3) Pt will meet with Dr.Cabos for follow-up (4) Pt will continue to take all medications as prescribed (5) Pt will complete all assignments before returning to PHP   Diagnosis: No Primary Diagnosis found   No diagnosis found.    Joesiah Lonon, LCSW 06/28/14

## 2014-07-02 ENCOUNTER — Other Ambulatory Visit (HOSPITAL_COMMUNITY): Payer: Self-pay

## 2014-07-03 ENCOUNTER — Other Ambulatory Visit (HOSPITAL_COMMUNITY): Payer: Self-pay

## 2014-07-03 NOTE — Progress Notes (Signed)
   THERAPIST PROGRESS NOTE  Session Time: 3:00-4:00  Participation Level: Minimal  Behavioral Response: CasualDrowsyEuthymic  Type of Therapy: Group Therapy  Treatment Goals addressed: Coping  Interventions: CBT  Summary: Sharon CarolinaKimberly Duthie is a 19 y.o. female who presents with depression.   Suicidal/Homicidal: Nowithout intent/plan  Therapist Response: Pt. Continued to present as sleepy, lethargic. Pt. Watched [employer]rene Brown video and participated in discussion about developing vulnerability as resistance to shame. Pt. Discussed how perfectionism and pretending/avoidance are using to numb.  Plan: Pt. To return to group tomorrow.  Diagnosis: Axis I: Depressive Disorder NOS    Axis II: No diagnosis    Shaune PollackBrown, Jennifer B, [facility] 07/03/2014

## 2014-07-03 NOTE — Progress Notes (Signed)
   THERAPIST PROGRESS NOTE  Session Time: 2:00-3:00  Participation Level: None  Behavioral Response: CasualDrowsyEuthymic  Type of Therapy: Group Therapy  Treatment Goals addressed: Coping  Interventions: CBT  Summary: Sharon [name] is a 19 y.o. female who presents with depression.   Suicidal/Homicidal: Nowithout intent/plan  Therapist Response: Pt. Reported that she was working until 4:00 in the morning. Pt. Appeared physically exhausted and slept during the group.  Plan: Return for tomorrow's group.  Diagnosis: Axis I: Depressive Disorder NOS    Axis II: No diagnosis    Shaune PollackBrown, Jennifer B, [facility] 07/03/2014

## 2014-07-04 ENCOUNTER — Other Ambulatory Visit (HOSPITAL_COMMUNITY): Payer: BLUE CROSS/BLUE SHIELD | Admitting: Psychiatry

## 2014-07-04 ENCOUNTER — Telehealth (HOSPITAL_COMMUNITY): Payer: Self-pay | Admitting: Licensed Clinical Social Worker

## 2014-07-04 DIAGNOSIS — F332 Major depressive disorder, recurrent severe without psychotic features: Secondary | ICD-10-CM | POA: Diagnosis not present

## 2014-07-04 DIAGNOSIS — F419 Anxiety disorder, unspecified: Secondary | ICD-10-CM

## 2014-07-04 NOTE — Psych (Signed)
[facility]  Izell CarolinaKimberly Macbride [id number]  Session Time: 11:00 AM -12:30 PM  Participation Level: Active  Behavioral Response: CasualAlertEuthymic  Type of Therapy: Group Therapy  Treatment Goals addressed: Anxiety, Coping and Diagnosis: MDD, Anxiety Disorder Unspecified  Interventions: Assertiveness Training and Other: Coping-healthy management of emotion  Summary: Izell CarolinaKimberly Mondesir is a 19 y.o. female who presents today after missing group for two days. She explained that she shut down because of drama and became anxious and paranoid and stayed in her room. Therapist provided supportive interventions but pointed out that this is when she needs to reach out for help and treatment when she is not feeling well. [name] is back on medication and it was a positive step to come to treatment today. She said that she also started journaling   It seems she is utilizing old coping strategies of not opening up to express her needs and feelings. Therapist needs to continue to work on building insight that not expressing needs and feelings has a negative impact on her, is not healthy coping and leads to negative consequences for her including escalation of mental health symptoms.   Suicidal/Homicidal: No  Therapist Response: The therapeutic intervention for patient was to point out that her unhealthy coping strategy of shutting down and not expressing needs has a negative impact on her mental health and leads to escalation of her mental health symptoms.   Plan: 1.Therapist work with [name] on building insight as to healthy ways to express needs and emotions and the need to utilize treatment to help her with this process.2.[name] start to utilize treatment therapeutically to work through emotions and learn to communicate her needs in healthy ways.   Session Time: 1:00 PM - 2:00 PM  Participation Level: Active  Behavioral Response: CasualAlertEuthymic  Type of Therapy: Group  Therapy  Treatment Goals addressed: Anxiety, Coping and Diagnosis: MDD, Anxiety Disorder Unspecified  Interventions: DBT-Emotional regulation  Summary: Therapist presented lecture on "Why We Have Emotions" from DBT module on emotional regulation. By understanding the purpose that emotions serve, it will help [name] developing strategies to manage. She related that this information helps her in seeing the positive functions they can serve and paying attention to when they get off course from the positive function. [name] relates that she is afraid to express her emotions, not always comfortable expressing her feelings and will ignore them. She says she uses food as a coping strategy. She will let things build up until she finally says something. At the same time, she said that she is getting better at expressing how she feels and is able to communicate how she feels in a calm manner. She is upset with her girlfriend who left after they fought and who "didn't take her emotions seriously". I think it was helpful for [name] to discuss her feelings in group after her relationship as I think that she still is shutting down when she has overwhelming feelings. She relates that journaling is helpful. I think therapist needs to explore underlying reasons why [name] feels uncomfortable expressing her needs and ignores needs such as self-esteem issues and therapist needs to work with [name] on assertiveness so she find ways to express needs in healthy ways. [name] has defenses in place such as distraction and diversion that keep us from looking at some of these issues.  Suicidal/Homicidal: No  Therapist Response: Therapist discussed DBT topic of emotional regulation and "why we have emotions". This helps [name] to understand the purpose that  helps her to manage emotions more effectively. Therapist identified that [name] still needs to work on Insurance account manager of negative emotions, expressing her needs and explore self-esteem issues.  Plan: 1.[name]  continue to learn DBT strategies of emotional regulation and implement to manage negative emotions.2.Therapist work with [name] on expressing her needs in healthy ways and self-esteem issues.      Diagnosis: Primary Diagnosis: Major depressive disorder, recurrent, severe without psychotic features [F33.2]    1. Major depressive disorder, recurrent, severe without psychotic features   2. Anxiety disorder, unspecified anxiety disorder type       Alitza Cowman A 07/04/2014
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
## 2014-07-05 ENCOUNTER — Ambulatory Visit (HOSPITAL_COMMUNITY): Payer: Self-pay | Admitting: Licensed Clinical Social Worker

## 2014-07-05 ENCOUNTER — Other Ambulatory Visit (HOSPITAL_COMMUNITY): Payer: Self-pay

## 2014-07-05 DIAGNOSIS — F332 Major depressive disorder, recurrent severe without psychotic features: Secondary | ICD-10-CM

## 2014-07-05 DIAGNOSIS — F419 Anxiety disorder, unspecified: Secondary | ICD-10-CM

## 2014-07-08 ENCOUNTER — Telehealth (HOSPITAL_COMMUNITY): Payer: Self-pay | Admitting: Licensed Clinical Social Worker

## 2014-07-08 ENCOUNTER — Other Ambulatory Visit (HOSPITAL_COMMUNITY): Payer: BLUE CROSS/BLUE SHIELD | Admitting: Licensed Clinical Social Worker

## 2014-07-08 DIAGNOSIS — F419 Anxiety disorder, unspecified: Secondary | ICD-10-CM

## 2014-07-08 DIAGNOSIS — F332 Major depressive disorder, recurrent severe without psychotic features: Secondary | ICD-10-CM
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
## 2014-07-08 NOTE — Psych (Signed)
   [facility]  Sharon [name] [id number]  Session Time: 1400-1600  Participation Level: Active  Behavioral Response: CasualAlertAnxious and Depressed  Type of Therapy: Psycho-education Group  Treatment Goals addressed: Anxiety, Coping and Diagnosis:   Interventions: CBT, DBT, Solution Focused, Strength-based, Psychosocial Skills: and Supportive  Summary: Sharon [name] is a 19 y.o. female who presents with worsening depressive and anxiety [name].  Pt states she was feeling disgusted, hurt, depressed, guilty and paranoid today.  Pt wouldn't disclose about her previous group; but states she discussed the reason she's feeling the way she is in that group.  Pt states she has been struggling with depression.  Discussed signs and [name] of depression and how it has impacted her life.  Pt mentioned feeling disappointed that she has to go to work today.   Suicidal/Homicidal: Nowithout intent/plan  Therapist Response: Provided feedback and support.  Plan: Continue the partial program and apply coping skills learned.  Diagnosis: Primary Diagnosis: Major depressive disorder, recurrent, severe without psychotic features F33.2   1. Major depressive disorder, recurrent, severe without psychotic features   2. Anxiety disorder, unspecified anxiety disorder type       Jeri Modenaita Clark, M.Ed 07-08-14

## 2014-07-09 ENCOUNTER — Other Ambulatory Visit (HOSPITAL_COMMUNITY): Payer: Self-pay

## 2014-07-10 ENCOUNTER — Other Ambulatory Visit (HOSPITAL_COMMUNITY): Payer: BLUE CROSS/BLUE SHIELD | Admitting: Psychiatry

## 2014-07-10 DIAGNOSIS — F332 Major depressive disorder, recurrent severe without psychotic features: Secondary | ICD-10-CM | POA: Diagnosis not present

## 2014-07-10 DIAGNOSIS — F419 Anxiety disorder, unspecified: Secondary | ICD-10-CM

## 2014-07-10 NOTE — Psych (Signed)
[facility] BH PHP THERAPIST PROGRESS NOTE  Rukiya Hodgkins [id number]  Session Time: 11:00 AM -12:30 PM  Participation Level: Minimal  Behavioral Response: CasualAlertEuthymic  Type of Therapy: Group Therapy  Treatment Goals addressed: Anxiety, Coping and Diagnosis: MDD, Anxiety Disorder Unspecified  Interventions: Other: Psychoeducation-Taking Care of Body, Mind and Spirit  Summary:  Therapist presented exercise with worksheet on "Balance of Body, Mind and Spirit". We discussed how attending to the body, mind, and spirit is essential for personal growth, health and healing. We reviewed how attending to the body includes sound nutrition, exercise, and healthy monitoring. It also includes learning to respect our body. Caring for our mind means being aware of and tending to our thoughts, attitudes, beliefs and values. Caring for our spirit includes appreciating and developing our emotional aspects and achieving greater connection to our inner. This was a helpful exercise for [name] to realize strategies to take care of mind, body and spirit and also that all must be attended to for health and growth. [name] was not feeling well through group so I do not think she was able to get as much from the group as she would have otherwise although she was appropriate and not disruptive. She reports nausea which has been an ongoing issue that I will discuss with treatment team. She also reports not taking her sleep medications because of schedule and I will also discuss with treatment team.   Suicidal/Homicidal: No  Therapist Response: Therapist presented worksheet on "Balance of Body, Mind and Spirit" to raise awareness of the need to attend to all three areas for growth, health and healing.  Therapist also assessed issues with medication compliance and health issues that will be discussed with the team.    Plan: 1.Therapist utilize strategies to help integrate care so [name] is able to work toward health through  taking care of her mind, body and spirit.2.[name] implement strategies to help her grow and heal through taking care of mind, body and spirit. 3.Therapist address medication issues and health related issues with treatment team.  Session Time: 1:00 PM - 2:00 PM  Participation Level: Minimal  Behavioral Response: CasualAlertEuthymic  Type of Therapy: Group Therapy  Treatment Goals addressed: Anxiety, Coping and Diagnosis: MDD, Anxiety Disorder Unspecified  Interventions:   Summary: The group topic focused on coping strategies for anxiety. Therapist pointed out that successfully getting through situations helps build up confidence and reduces stress to get through other situations that are stressful. It helps you to challenge your own anxious thoughts and realize the distortions and it also helps you build skills through your experience of facing anxious situation.Therapist pointed out that the biggest challenge is usually just showing up for the situation and once you have done that you have accomplished almost all of what you needed to accomplish. We discussed problem solving as well to reduce anxiety and help [place] face anxious situations. This was useful for [name] to help her with cognitive and problem solving strategies to help manage her anxiety and avoidance. [name] was not feeling good again in group. Therapist reviewed with Dr. Jama Flavors and nurse, Hayes Ludwig was able discuss with patient issues around nausea.    Suicidal/Homicidal: No  Therapist Response: Therapist led group discussion to identify CBT strategies and problems solving to address anxiety and avoidance. Dr. Jama Flavors and Gregary Signs were able to review issues around nausea with patient.   Plan:1.[name] utilize CBT strategies to help her with anxiety and avoidance in life situations.Marland Kitchen2.Therapist work with [name] to reinforce positive instances  of using strategies to motivate her to continue to utilize healthy coping.3.[name] follow suggestions of treatment team to  help her address nausea.     Diagnosis: Primary Diagnosis: Major depressive disorder, recurrent, severe without psychotic features [F33.2]    1. Major depressive disorder, recurrent, severe without psychotic features   2. Anxiety disorder, unspecified anxiety disorder type       Bowman,[name] 07/10/2014

## 2014-07-11 ENCOUNTER — Other Ambulatory Visit (HOSPITAL_COMMUNITY): Payer: Self-pay

## 2014-07-11 ENCOUNTER — Telehealth (HOSPITAL_COMMUNITY): Payer: Self-pay | Admitting: Licensed Clinical Social Worker

## 2014-07-11 NOTE — Progress Notes (Signed)
PHP Medication Management Note  07/10/14   Duration- 25 minutes    Dx: Depression, PTSD    Subjective: Patient reports she is doing well. Today she is experiencing  Significant nausea, and states she has been nauseous for a  Few days. She does not think it is related to psychiatric medications as she has had episodes of nausea before and does not feel nausea is temporally related to her medication trials. She recently moved into a new apartment , which is positive as she was having significant discord with prior roommate. She states, however, that she is stressed because move has been  Complicated by new apartment's water pipe bursting.  PTSD symptoms have tended to improve- does have some ongoing ruminations, but at this time denies any severe withdrawal or avoidance, and is sleeping better.   Objective: she states she is doing "OK". At this time describes improving symptoms.  As noted, current nausea not felt to be associated with medications. Of note, denies any possibility of pregnancy. Has been able to eat, take PO fluids and was eating some chips during this interaction. As discussed with treatment team/therapists, patient's participation in PHP is variable. Patient states she is back to working one of her jobs , which makes attendance somewhat more challenging.  Denies medication side effects- perceives them as helpful. As noted, states that she noticed a definite worsening of mood and anxiety when she was off them for about a week recently.  No current significant neuro vegetative symptoms.    MSE :        General Appearance: well groomed   Eye Contact::  Good  Speech:  Normal Rate  Volume:  Normal  Mood:  States she is feeling "OK"  Affect:  Appropriate , reactive , slightly anxious   Thought Process:  Goal Directed and Linear  Orientation:  Full (Time, Place, and Person)  Thought Content: no psychotic symptoms  Suicidal Thoughts: currently denies any SI   Homicidal  Thoughts:  No  Memory:  Recent and remote grossly intact   Judgement: improved   Insight:  Improved   Psychomotor Activity:  Normal  Concentration:  Good  Recall:  Good  Fund of Knowledge:Good  Language: Good  Akathisia:  NA  Handed:  Right  AIMS (if indicated):     Assets:  Desire for Improvement Resilience Talents/Skills  ADL's:  Fair   Cognition: WNL       Current Medications:  Celexa 20 mgrsd QAM Minipress 1 mgrs QHS Seroquel 50 mgrs QHS    Assessment-  Patient improved compared to prior symptoms/presentation. Tends to minimize symptoms and often uses humor to deflect anxiety. She is facing some significant stressors related to recent move from one apartment to another, and new one having plumbing issues. Reports nausea ( but good PO intake) x  A few days, does not feel it is related to medications. Also, denies any pregnancy. We did review how SSRI and Seroquel can cause nausea as side effects. PTSD symptoms decreasing, patient seems less focused on this .  Agrees to continue medication management as below.  Treatment Plan  Continue PHP  Seroquel 50 mgrs QHS Celexa 20 mgrs QDAY  Minipress 1 mgr QHS     Nehemiah MassedOBOS, Seniyah Esker MD

## 2014-07-12 ENCOUNTER — Other Ambulatory Visit (HOSPITAL_COMMUNITY): Payer: Self-pay

## 2014-07-15 ENCOUNTER — Telehealth (HOSPITAL_COMMUNITY): Payer: Self-pay | Admitting: Licensed Clinical Social Worker

## 2014-07-15 ENCOUNTER — Ambulatory Visit (HOSPITAL_COMMUNITY): Payer: Self-pay | Admitting: Licensed Clinical Social Worker

## 2014-07-15 DIAGNOSIS — F419 Anxiety disorder, unspecified: Secondary | ICD-10-CM

## 2014-07-15 DIAGNOSIS — F332 Major depressive disorder, recurrent severe without psychotic features: Secondary | ICD-10-CM

## 2014-07-15 NOTE — Psych (Signed)
   [facility] TH[facility]  Sharon [name] [id number]  Session Time: 2:00-4:00  Participation Level: Minimal  Behavioral Response: Bizarre and GuardedDrowsy and LethargicAnxious and Irritable  Type of Therapy: Group Therapy  Treatment Goals addressed: Anxiety and Coping  Interventions: CBT, Strength-based, Supportive and Social Skills Training  Summary: Sharon [name] is a 19 y.o. female who presents with irritable mood. She was lying on the sofa rocking back and forth complaining of severe abdominal pain when the therapist entered the group room. Therapist suggested she visit the nurse to access her pain. She agreed and immediately returned "feeling better". She denies being administered any medication. She was able to participate in group activities shortly afterward and expressed that she "can't remember" why she did not feel well.   Suicidal/Homicidal: Negativewithout intent/plan  Therapist Response: The therapist's compliance with the physician's prescription for psychotropic medication was monitored for the medication's effectiveness and side effects. Therapist questioned patient's medication compliance to access whether it has been beneficial to her in reducing her experience of anxiety symptoms; the benefits of this progress were reviewed. She was taught about how anxious fears are maintained by a cycle of unwarranted fear and avoidance that precludes positive, corrective experiences with the feared object or situation. Sharon SetaHeather was also taught about how treatment breaks the anxiety cycle by encouraging positive, corrective experiences.   Plan:  (1) [facility] (2) Pt will discuss and plan for a family therapy session (3) Pt will meet with Dr.Cabos for follow-up (4) Pt will continue to take all medications as prescribed (5) Pt will complete all assignments before returning to PHP   Diagnosis: No Primary Diagnosis found   No diagnosis  found.    Sharon Qin, LCSW 07/10/14

## 2014-07-16 ENCOUNTER — Other Ambulatory Visit (HOSPITAL_COMMUNITY): Payer: BLUE CROSS/BLUE SHIELD | Admitting: Licensed Clinical Social Worker

## 2014-07-16 DIAGNOSIS — F332 Major depressive disorder, recurrent severe without psychotic features: Secondary | ICD-10-CM | POA: Diagnosis not present

## 2014-07-16 NOTE — Psych (Signed)
   [facility]  Sharon [name] [id number]  Session Time: 1:00 PM - 2:00 PM  Participation Level: Minimal  Behavioral Response: CasualAlertAnxious  Type of Therapy: Group Therapy  Treatment Goals addressed: Anxiety, Coping and Diagnosis: MDD, Strategies to manage Stress  Interventions: CBT and Other: Positive Psychology  Summary: Sharon [name] is a 19 y.o. female who presents today and therapist addressed absences and that she was late today. [name] says that she has tried to get a hold of therapist but she does not leave messages. She reports trying to manage stressors including withdrawing from school. [name] watched and discussed a video by Derrick RavelShawn Anchor about positive psychology. The concept introduced how we look at life determines our experiences and that there are strategies to help us "rewire our brain". The strategies include journal writing, writing three things to be grateful for, exercise, random acts of kindness and meditation. [name] described a high level of anxiety, laid on the couch, on the cell phone and explained she was trying to deal with protocol of withdrawing from school. She was distracted during group activity and not engaged.    Suicidal/Homicidal: No  Therapist Response: Therapist highlighted strategies from video on Positive Psychology that could be used to improve mood.  Plan: 1.[name] work on better engagement to utilize strategies to help with anxiety and mood. mood.2.Therapist continue to educate [name] on effective coping strategies to help improve mood.    Diagnosis: Primary Diagnosis: Major depressive disorder, recurrent, severe without psychotic features [F33.2]    1. Major depressive disorder, recurrent, severe without psychotic features       Jerilynn Feldmeier A 07/16/2014

## 2014-07-16 NOTE — Psych (Signed)
07/16/14  [facility]-  Medication Management Note.  Duration-  25 Minutes  Dx- MDD and PTSD   Subjective :   Patient states that over recent days she has been feeling more anxious and " hyper " at times. She attributes some of this to college related stress. States she has decided to take some time off college so she can focus and be less stressed , but that it has been hard to get the letters and e mails from her teachers needed not to lose her financial aid. Does continue to work 30 hours a week.  Issues at her new apartment are getting " better", after plumber fixed some leaky water pipe. Denies side effects on medication. She had reported some recent increased nausea, and states that although it has not resolved completely, it has improved partially, and is getting better. No vomiting, no diarrhea, no abdominal pain. Patient has a history of seizure disorder, but has had no recent seizures and is taking Keppra- this is prescribed by her Neurologist .   Objective : As noted, patient reports some increased anxiety and a subjective sense of being " hyper", which may be at least partially related to college related stress, as she tries to withdraw her semester without losing her financial aid. She states her mood is "OK", and minimizes sadness or depression at this time. She continues to do well in her daily activities and as noted continues to work almost full time . She has become more insightful regarding the unmanagability of her prior  Schedule , when she was working two full time jobs and going to college as well. She does feel  The stress related to this contributed to her anxiety , decompensation and is taking steps to decrease daily load in order to focus more on self and have more down time. PTSD symptoms reported as improved- states she is sleeping "OK" on most nights, without nightmares, and has had no major avoidance or hypervigilance lately. Denies medication side effects    MSE- at this time she is alert, attentive, well related,  Today somewhat more anxious, and a little less eye contact than last visit, mood is  "OK", and her affect is reactive, somewhat anxious today. No thought disorder, no SI , no HI, no psychotic symptoms    Current Medications:  Celexa 20 mgrs QAM, Minipress 1 mgr QHS,   Seroquel   50 mgrs QHS, Trazodone 25 mgrs QHS PRN, Keppra 500 mgrs QAM and 250 mgrs QHS  Assessment: Some increased anxiety, subjective sense of worry related to college stress, trying to withdraw from college this semester. PTSD symptoms improved. No SI. Tolerating medications well. We discussed potential increase /adjustment in meds/doses to address anxiety but prefers to stay on current regimen as effective and well tolerated   Plan: Continue medications as above. At this time does not need any new medications. Continue PHP level of care, which she is currently benefiting from.     Nehemiah MassedFernando Cobos,  MD

## 2014-07-18 ENCOUNTER — Other Ambulatory Visit (HOSPITAL_COMMUNITY): Payer: BLUE CROSS/BLUE SHIELD | Admitting: Licensed Clinical Social Worker

## 2014-07-18 ENCOUNTER — Telehealth: Payer: Self-pay | Admitting: *Deleted

## 2014-07-18 DIAGNOSIS — F332 Major depressive disorder, recurrent severe without psychotic features: Secondary | ICD-10-CM | POA: Diagnosis not present

## 2014-07-18 DIAGNOSIS — F419 Anxiety disorder, unspecified: Secondary | ICD-10-CM

## 2014-07-18 NOTE — Telephone Encounter (Signed)
Per our records at last OV:  1. I will increase her Keppra to 750 mg in the morning, 2 tablets in the evening I called the patient back to clarify what dose she takes.  Got no answer.  Left message.

## 2014-07-18 NOTE — Telephone Encounter (Signed)
Behavioral health is calling on behave of the patient. She is needing a refill on Keppra 500. Please call the patient to let her know when this is called in.

## 2014-07-18 NOTE — Psych (Signed)
   [facility]  Sharon [name] [id number]  Session Time: 11:00 AM -12:30 PM  Participation Level: None  Behavioral Response: CasualLethargicquiet  Type of Therapy: Group Therapy  Treatment Goals addressed: Anxiety, Coping and Diagnosis: MDD, Anxiety Disorder, unspecified type  Interventions: Other: Coping-Strategies to Build Self-Esteem  Summary:  Therapist lead discussion on self-esteem and discussed how we are the ones with the power to define and change ourselves and not let ourselves be defined by others. We have the capacity to change things and make better decisions. People will find something negative but it is not about your but more about themselves. This is an important exercise for Sharon [name] in managing her stressors and helping her to be self-empowered. Sharon [name] was quiet, lethargic seemed distracted and not engaged in discussion.    Suicidal/Homicidal: No  Therapist Response: Therapist presented [name] self-esteem exercise that explains that we are the ones with the power to define and change ourselves and not let ourselves be defined by others.   Plan: 1.Patient continue to learn and apply coping skills to work on managing stress and improviing self-esteem.2.Patient apply strategies to real life situations.    Session Time: 1:00 PM - 2:00  PM  Participation Level: Active  Behavioral Response: CasualLethargicDepressed  Type of Therapy: Group Therapy  Treatment Goals addressed: Anxiety, Coping and Diagnosis: MDD, Anxiety Disorder, unspecified type  Interventions: Coping-Self-Esteem Building  Summary: Therapist led activity where group members had to tell people different things about themselves that they did not know about each other. This was [name] self-esteem building exercise. This activity enabled the group members to make positive self-descriptive statements and an activity where she had to be willing to take some risk to get involved in new activities. Sharon [name]  did not participate in group activity and during group put her head on arm of chair. At times it seemed she was sleeping. At end of group therapist asked her about attendance and whether group was helpful for her. She became tearful explained the extreme stressors and her difficulty coping. She also said that she felt that group was [name] support and helpful but she still had trouble accepting help and acknowledging that she was struggling. She explained that she was the one who always helped others. I think that Sharon [name] is unstable and fragile and needs to continue to treatment until her mood improves and she is more stable.   Suicidal/Homicidal: no  Therapist Response: Therapist introduced [name] self-esteem building activity where patient has to make positive self-descriptive statements to group members. Sharon [name] did not participate and therapist was able after to access that she was significantly depressed, unstable and need of more treatment.   Plan:1.Patient take positive steps to work on self-esteem.2.Therapist continue to provide interventions that will help Sharon [name] in stabilizing and improving mood.3.Sharon [name] utilize the group to help her in learning strategies to improve mood and to manage stressors.    Diagnosis: Primary Diagnosis: Major depressive disorder, recurrent, severe without psychotic features [F33.2]    1. Major depressive disorder, recurrent, severe without psychotic features   2. Anxiety disorder, unspecified anxiety disorder type       Sharon [name] 07/18/2014

## 2014-07-19 ENCOUNTER — Other Ambulatory Visit (HOSPITAL_COMMUNITY): Payer: BLUE CROSS/BLUE SHIELD | Attending: Psychiatry | Admitting: Licensed Clinical Social Worker

## 2014-07-19 ENCOUNTER — Ambulatory Visit (HOSPITAL_COMMUNITY): Payer: Self-pay | Admitting: Licensed Clinical Social Worker

## 2014-07-19 DIAGNOSIS — F4310 Post-traumatic stress disorder, unspecified: Secondary | ICD-10-CM | POA: Diagnosis not present

## 2014-07-19 DIAGNOSIS — F419 Anxiety disorder, unspecified: Secondary | ICD-10-CM

## 2014-07-19 DIAGNOSIS — F332 Major depressive disorder, recurrent severe without psychotic features: Secondary | ICD-10-CM

## 2014-07-19 MED ORDER — LEVETIRACETAM 750 MG PO TABS: ORAL_TABLET | ORAL | Status: DC

## 2014-07-19 NOTE — Telephone Encounter (Signed)
I called patient again.  Got no answer.  I called Behavioral Health at [phone].  Spoke with nurse Tresa EndoKelly.  Explained we had been prescribing 750mg  tabs.  She verified they did not alter med dose.  When they last spoke with the patient, they recommended she contact our office for a follow up appt.  If they speak with her, they will advise we are trying to reach her as well.

## 2014-07-19 NOTE — Psych (Signed)
   [facility] THERAPIST PROGRESS [facility]  Sharon [name] [id number]  Session Time: 2:00-4:00  Participation Level: None  Behavioral Response: Guarded and exhaustedDrowsyDepressed and Hopeless  Type of Therapy: Group Therapy  Treatment Goals addressed: Anxiety and Communication: Social Skills  Interventions: CBT, Strength-based, Supportive and Social Skills Training  Summary: Sharon [name] is a 19 y.o. female who presents with extreme drowsiness. Sharon [name] appeared to have fallen asleep during the group. She appeared very distraught and tearful as she disclosed that she does not like to ask for her and feels that to ask is a burden on others.  Sharon BattenKim stated that she did not want to be discharged at this time and has stated that she will commit to attendance and participation. She expressed to the therapist that she had a seizure on yesterday because she was not able to get her medication due to an issue with insurance.   Suicidal/Homicidal: Negativewithout intent/plan  Therapist Response: Therapist made several attempts to wake her to no avail. Therapist expressed concern about her sporatic attendance, inattentiveness during group, sleeping during session and lack of participation. Sharon BattenKim was offered the option to discharge from the group if she did not feel it was effective or to commit to participation and attendance. Therapist listened empathetically and addressed her worries and fears by stating the purpose of therapy and the benefits of the the therapeutic relationship. Therapist referred her to the nurse assisant in order to assist with any medication related issues and correspondence needed.   Plan:  (1) [facility] (2) Pt will discuss and plan for a family therapy session (3) Pt will meet with Dr.Cabos for follow-up (4) Pt will continue to take all medications as prescribed (5) Pt will complete all assignments before returning to PHP   Diagnosis: No Primary Diagnosis  found   No diagnosis found.    Ronnika Collett, LCSW 07/18/14

## 2014-07-19 NOTE — Telephone Encounter (Signed)
I spoke with the patient who verified she is still taking Keppra 750mg .  She is aware Rx has been sent.

## 2014-07-21 NOTE — Psych (Signed)
   [facility]  Sharon [name] [id number]  Session Time: 11:00 AM - 12:30 PM  Participation Level: Active  Behavioral Response: CasualAlertEuthymic and Irritable  Type of Therapy: Group Therapy  Treatment Goals addressed: Anxiety, Coping and Diagnosis: MDD, Anxiety Unspecified, Cognitive Strategies to Manage Depression   Interventions: CBT, DBT and Other: Coping-Healthy Coping Strategies for Depression  Summary: Sharon [name] is [name] 19 y.o. female who presents today with more engagement in group. She identified group as [name] support so she was challenged to be more active in participation and better in attendance. She was irritable about [name] stressor but at the same time she discussed active steps she is taking to manage which are positive signs that she has more energy and drive to address stressors.Patient participated in group about managing depressive symptoms. Patient was helped to recognize that isolating behaviors can escalate behaviors and to act opposite to the emotion to alleviate the depressive symptoms. Patient learned that there are different points of the emotional experience that [name] person can intervene. Patient was educated on addressing vulnerabilities that lead to depression such as nutrition, exercise, and drug/alcohol use. The patient was supported in identifying specific self-talk that precipitates feelings of depression. The patient was trained in more realistic, cognitive messages that relieve depression. Patient was asked to identify [name] positive activity that they would engage in to help build positive emotions. This was helpful discussion for Sharon [name] who still does not manage her depressive symptoms in healthy ways.   Suicidal/Homicidal: No  Therapist Response: Therapist educated patient on different strategies to help alleviate depressive symptoms.   Plan: 1.Sharon [name] implement healthy strategies to manage depression.2.Therapist provide motivational strategies  to help Sharon [name] implement healthier strategies to manage depression.  Session Time: 1:00 PM - 2:00 PM  Participation Level: Active  Behavioral Response: CasualAlertEuthymic  Type of Therapy: Group Therapy  Treatment Goals addressed: Anxiety, Coping and Diagnosis: MDD, Anxiety Unspecified, Cognitive Strategies to Manage Anxiety    Interventions: CBT, DBT and Other: Coping-Healthy Coping Strategies for Anxiety  Summary: Patient leaned through therapist input and group discussion different strategies to manage anxiety. We discussed how the best way to manage stress is to get it out of the way, the effectiveness of affirmations, self-talk that puts oneself first and acceptance of imperfection, the limits of our control over situations, and how not facing the situation makes the stress worse. We reviewed that it is helpful to be organized and giving ourselves rewards for making efforts to address projects that have been stressful. We discussed how certain strategies such as avoidance and substance use that helps use not to feel makes our problems worse. Patient was [name] very positive member of group arguing against negative group members for the value of implementing healthy coping strategies.   Suicidal/Homicidal: No  Therapist Response: Therapist discussed strategies to manage anxiety.  Plan:1.Therapist provide motivational strategies to help Sharon [name] in continuing to implement healthy strategies to manage anxiety.2.Sharon [name] apply healthy strategies to manage life situations.   Diagnosis: Primary Diagnosis: Major depressive disorder, recurrent, severe without psychotic features [F33.2]    1. Major depressive disorder, recurrent, severe without psychotic features   2. Anxiety disorder, unspecified anxiety disorder type       Sharon [name],Sharon [name] 07/21/2014

## 2014-07-22 ENCOUNTER — Telehealth (HOSPITAL_COMMUNITY): Payer: Self-pay | Admitting: Licensed Clinical Social Worker

## 2014-07-22 ENCOUNTER — Other Ambulatory Visit (HOSPITAL_COMMUNITY): Payer: Self-pay

## 2014-07-22 ENCOUNTER — Ambulatory Visit (HOSPITAL_COMMUNITY): Payer: Self-pay | Admitting: Licensed Clinical Social Worker

## 2014-07-22 DIAGNOSIS — F332 Major depressive disorder, recurrent severe without psychotic features: Secondary | ICD-10-CM

## 2014-07-22 DIAGNOSIS — F419 Anxiety disorder, unspecified: Secondary | ICD-10-CM

## 2014-07-22 NOTE — Psych (Signed)
   [facility]  Sharon CarolinaKimberly Koehne [id number]  Session Time: 2:00-4:00  Participation Level: Active  Behavioral Response: Casual, Neat and Well GroomedAlertAnxious, Euphoric, Irritable and Happy  Type of Therapy: Group Therapy  Treatment Goals addressed: Anxiety, Communication: Self Esteem and Coping  Interventions: CBT, Motivational Interviewing, Solution Focused, Strength-based and Assertiveness Training  Summary: Sharon [name] is a 19 y.o. female who presents with improved mood and increasing self esteem.Therapist observed that as [name]'s self-esteem has increased he is beginning to affirm her self-worth. [name] verbalized positive feelings toward herself and others in the group. She was supported as she shared experiences from her interpersonal relationships and past experiences. She was supported as she revealed experiences with critical and rejecting peers that led to feelings of social anxiety and low self-esteem. Patients were asked to complete a cost-benefit analysis of mental filters in which they were asked to list the advantages and disadvantages of anxiety which contributes to issues of low self-esteem. Patients were asked to report on the usefulness of completing a cost-benefit analysis exercise. They were asked to process and report the relevance of the beneficial insight into the impact of future anxiety causing situations in their daily lives.  Suicidal/Homicidal: Negativewithout intent/plan  Therapist Response: Therapist noted that during discussions many of the patients made decisions based on lack of confidence, low self-esteem, reality misperceptions and low energy. Therapist assessed for signs of social withdrawal among patient comments, affect, and group interactions. Therapist encouraged patients to identify and verbalize positive affirmation statements about themselves. Therapist assisted patients and solicited input from other group members when a  member appeared reluctant, despondent or incapable of verbalizing positive statements about themselves. Therapist taught patients coping skills to help increase verbalization of positive self-statements and facilitated role-play and practice among group members.  Therapist focused session on exploring, processing, identifying and reframing self-defeating interpersonal, social and environmental patterns and beliefs.    Plan:  (1) [facility] (2) Pt will discuss and plan for a family therapy session (3) Pt will meet with Dr.Cabos for follow-up (4) Pt will continue to take all medications as prescribed (5) Pt will complete all assignments before returning to PHP    Diagnosis: No Primary Diagnosis found   No diagnosis found.    Adelheid Hoggard, LCSW 07/19/14

## 2014-07-23 ENCOUNTER — Other Ambulatory Visit (HOSPITAL_COMMUNITY): Payer: BLUE CROSS/BLUE SHIELD | Admitting: Licensed Clinical Social Worker

## 2014-07-23 DIAGNOSIS — F332 Major depressive disorder, recurrent severe without psychotic features: Secondary | ICD-10-CM | POA: Diagnosis not present

## 2014-07-23 DIAGNOSIS — F419 Anxiety disorder, unspecified: Secondary | ICD-10-CM

## 2014-07-23 NOTE — Psych (Signed)
D. Met with patient due to concern she is not feeling well today in group.  Patient reported she was "up a lot last night with a sore throat and congestion".  Patient concerned she may have strep throat or upper respiratory infection.  A. Patient's vitals BP = 109/68, P = 67, temp - 99.0, R = 16 and did present with red edematous area in the back of her throat and foul smell with breathing.  R. Patient reported no other symptoms or concerns and stated she could go to her school clinic at [place] this evening to be checked out.  Patient with no SI/HI or other symptoms and requested she leave group in time to be evaluated at school clinic.  Patient agreed with plan and will keep this nurse and PHP staff informed of status.  Patient encouraged to seek primary care services and to follow their instruction for possible infection in throat area as denied any history of allergies and discomfort times one day.

## 2014-07-23 NOTE — Psych (Signed)
   [facility]  Sharon CarolinaKimberly Ephraim [id number]  Session Time: 11:00 AM - 12:30 PM  Participation Level: Active  Behavioral Response: CasualAlertEuthymic  Type of Therapy: Group Therapy  Treatment Goals addressed: Anxiety, Coping and Diagnosis: MDD, Anxiety Disorder, Unspecified, Learning Positive Coping Strategies  Interventions: Other: Coping-Managing Depression, CBT  Summary: Sharon [name] is a 19 y.o. female who presents to group explaining that she is sick. Today's discussion was on topic of depression and how negative social interactions can impact one's mood. [name] participated by actively listening and contributing to open discussion. She reinforced the positive insight that you have to find a support group that supports who your are and that you can't allow others to devalue for superficial reasons. I think is opening up more to the group and using the group therapeutically for support to work on her issues. She shares positive insights about healthy coping strategies.    Suicidal/Homicidal: No  Therapist Response: Therapist reinforced positive insights shared by patient about how developing a good support group and not letting others devalued you as good coping strategies for depression.   Plan: 1.[name] utilize the group therapeutically to work on her issues.2.[name] gain insight and apply healthy coping strategies to manage depression and stressors.  Session Time: 1:00 PM-2:00 PM  Participation Level: Active  Behavioral Response: CasualAlertIrritable  Type of Therapy: Group Therapy  Treatment Goals addressed: Anxiety, Coping and Diagnosis: MDD, Anxiety Disorder, Unspecified  Interventions: DBT-Distress Tolerance-Improve the Moment, Mindfulness,   Summary: Patient participated in group where she reviewed concept of distress tolerance which is acceptance of negative emotion and finding healthy ways to manage emotions. It is about getting through a difficult  experience without making it worse. Patient learned that this is a natural progression of mindfulness skills and she learned about the skill of Core Mindfulness Skills. She learned that mindfulness is a different way to relate to negative emotions and that one is more detached and observer of their emotions. She was introduced as well to Improve the Moment in Distress Tolerance skills. We discussed that these are strategies that include changing your view of yourself, changing the way you view the situation and changing your bodily responses. [name] contributed to group discussion and shared healthy insight of the difficulty dealing with emotions and coming to terms with feelings. She said that it is hard to think you don't have it all together. This indicates to this therapist that she is opening up in group and working on her emotions.    Suicidal/Homicidal: No  Therapist Response: Therapist reviewed concept of Distress Tolerance and taught concept of Mindfulness, Crisis Survival and Improve the Moment. Therapist provided supportive input as [name] is using the group more therapeutically to work on issues.   Plan: 1.Patient learn strategies for emotional regulation.2.Patient apply these strategies to life situations.    Diagnosis: Primary Diagnosis: Major depressive disorder, recurrent, severe without psychotic features [F33.2]    1. Major depressive disorder, recurrent, severe without psychotic features   2. Anxiety disorder, unspecified anxiety disorder type       Bowman,[name] 07/23/2014
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
## 2014-07-24 ENCOUNTER — Ambulatory Visit (HOSPITAL_COMMUNITY): Payer: Self-pay | Admitting: Licensed Clinical Social Worker

## 2014-07-24 ENCOUNTER — Other Ambulatory Visit (HOSPITAL_COMMUNITY): Payer: BLUE CROSS/BLUE SHIELD | Admitting: Licensed Clinical Social Worker

## 2014-07-24 DIAGNOSIS — F332 Major depressive disorder, recurrent severe without psychotic features: Secondary | ICD-10-CM

## 2014-07-24 DIAGNOSIS — F419 Anxiety disorder, unspecified: Secondary | ICD-10-CM

## 2014-07-24 NOTE — Psych (Signed)
   [facility]  Sharon [name] [id number]  Session Time: 1:00 PM -2:00 PM  Participation Level: Active  Behavioral Response: CasualAlertEuthymic  Type of Therapy: Group Therapy  Treatment Goals addressed: Anxiety, Coping and Diagnosis: MDD, [name] utilize strategies to [employer]Challenge Cognitive Distortions and Regulate emotions  Interventions: CBT, DBT and Other: Seeking Safety  Summary: Sharon CarolinaKimberly Mondo is a 19 y.o. female who presents today late because she explains that she was trying to get to the doctor today because she is sick but does not want to miss group. Patient was patient was given a handout from seeking safety on "Harshness vs Compassion". The exercise taught that compassion promotes growth, while harshness prevents growth. Compassion means searching with an open, nonjudgmental mind of what happened. This promotes real change.This theme was tied to issues with self-esteem and negative self-talk. Patient learned strategies to challenge self-talk and negative thinking. [name] says her problem is not negative self-talk about herself but negative thoughts. She says she blocks them by distraction of eating, watching, television. We reviewed whether her coping is healthy and this is guided by whether it is excessive or causing some type of harm. At this point [name] could not identify anything harmful. Therapist tied this topic to distress tolerance and that the goal of therapy is to find a variety of coping strategies to manage negative emotions.    Suicidal/Homicidal: No  Therapist Response: Therapist presented worksheet from [employer] on "Harshness vs Compassion and discussed how harsh self talk prevents growth and discussed ways to increase compassion with oneself when you notice harsh self-talk. Therapist tied this to distress tolerance and healthy and unhealthy strategies to manage distressing thoughts. Therapist explained the work in therapy would be to work on  healthy ways to manage distressing thoughts.  Plan: 1.[name] apply healthy strategies to manage negative thoughts and emotions.2.Therapist teach [name] a variety of strategies that she can use to manage negative thoughts and emotions.   Diagnosis: Primary Diagnosis: Major depressive disorder, recurrent, severe without psychotic features [F33.2]    1. Major depressive disorder, recurrent, severe without psychotic features   2. Anxiety disorder, unspecified anxiety disorder type       Abass Misener A 07/24/2014
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
## 2014-07-25 ENCOUNTER — Ambulatory Visit (HOSPITAL_COMMUNITY): Payer: Self-pay | Admitting: Licensed Clinical Social Worker

## 2014-07-25 ENCOUNTER — Other Ambulatory Visit (HOSPITAL_COMMUNITY): Payer: Self-pay

## 2014-07-25 DIAGNOSIS — F419 Anxiety disorder, unspecified: Secondary | ICD-10-CM

## 2014-07-25 DIAGNOSIS — F332 Major depressive disorder, recurrent severe without psychotic features: Secondary | ICD-10-CM

## 2014-07-25 NOTE — Psych (Signed)
   [facility]  Sharon CarolinaKimberly Feldkamp [id number]  Session Time: 2:00-4:00  Participation Level: Active  Behavioral Response: Casual and NeatAlertEuphoric  Type of Therapy: Group Therapy  Treatment Goals addressed: Communication: Healthy Relationships  Interventions: CBT, Assertiveness Training, Psychologist, occupationalocial Skills Training and Reframing  Summary: Sharon [name] is a 19 y.o. female who presents with improved mood. Patients were asked to role-play and examine the benefits and consequences of healthy vs. unhealthy relationship patterns.  Each was asked to establish connections between their own behaviors and the way they are treated or have mistreated others. Behavior patterns were reviewed to understand how positive relationships should work and how  An inability to adequately express one's true emotions can impair trust and result in the loss of trust in relationships.  Suicidal/Homicidal: Negativewithout intent/plan  Therapist Response: Therapist actively listened as patients identified the negative impact of anger and were provided with specific examples of how anger has negatively impacted their life and relationships. Therapist provided support and encouragement to patients while teaching them to recognize unhealthy patterns by reviewing past relationships and present social interactions with others. Therapist explored the opinion that any meaningful relationship is based on trust and that the other person should treat you with kindness and respect. Therapist sought clarification when patients were unable to identify with the concepts. Therapist modeled interactive patterns in both a healthy vs. unhealthy relationship.  Therapist was able to help client redefine the way in which their self-defeating life patterns, values and beliefs have contributed to the way they interact with others on a daily basis.   Plan:  (1) [facility] (2) Pt will discuss and plan  for a family therapy session (3) Pt will meet with Dr.Cabos for follow-up (4) Pt will continue to take all medications as prescribed (5) Pt will complete all assignments before returning to PHP   Diagnosis: No Primary Diagnosis found   No diagnosis found.    Ashleah Valtierra, LCSW 07/24/14

## 2014-07-26 ENCOUNTER — Other Ambulatory Visit (HOSPITAL_COMMUNITY): Payer: BLUE CROSS/BLUE SHIELD | Admitting: Licensed Clinical Social Worker

## 2014-07-26 ENCOUNTER — Ambulatory Visit (HOSPITAL_COMMUNITY): Payer: Self-pay | Admitting: Licensed Clinical Social Worker

## 2014-07-26 DIAGNOSIS — F332 Major depressive disorder, recurrent severe without psychotic features: Secondary | ICD-10-CM

## 2014-07-26 DIAGNOSIS — F419 Anxiety disorder, unspecified: Secondary | ICD-10-CM

## 2014-07-26 NOTE — Psych (Signed)
   [facility] THERAPIST PROGRESS NOTE  Sharon CarolinaKimberly Tyson [id number]  Session Time: 2:00-4:00  Participation Level: Active  Behavioral Response: CasualAlertAnxious and Euphoric  Type of Therapy: Group Therapy  Treatment Goals addressed: Anxiety and Communication: Social Skills  Interventions: CBT, Strength-based, Energy managerAnger Management Training and Social Skills Training  Summary: Sharon [name] is a 19 y.o. female who presents with symptoms related to anxiety. Sharon [name] reports that she has withdrawn from school but is experiencing anxiety related symptoms because she is not sure how the withdrawal will affect both her housing and her medical insurance.Patients were taught and asked to demonstrate their problem-solving skills. Patients were asked to define a problem clearly, brainstorming multiple solutions, listing the pros and cons of each solution, seeking input from others, selecting and implementing a plan of action, and evaluating and readjusting the outcome. Patient was able to display a clear understanding of the use of the problem-solving skills, and demonstrated this through examples  Suicidal/Homicidal: Negativewithout intent/plan  Therapist Response: Therapist instructed patients to routinely use the strategies that he/she has learned in therapy (e.g., cognitive restructuring, exposure).Therapist urged patients to find ways to build these new strategies into their lives as much as possible. Therapist encouraged patients to demonstrate ways in which they have incorporated coping strategies into their life and daily routines.  Techniques from acceptance and commitment therapy (ACT) were used to help patients face and deal with uncomfortable realities. Patients were asked to utilize a given scenario to identify and formulate acceptance towards situations in which they do not have complete control, has imperfection or needs to tolerate uncertainty and unpleasant emotions. Therapist assisted patients  in processing and accepting uncomfortable situations and committing to utilizing reality based cognitive techniques to problem solve.   Plan: (1) [facility] (2) Pt will discuss and plan for a family therapy session (3) Pt will meet with Dr.Cabos for follow-up (4) Pt will continue to take all medications as prescribed (5) Pt will complete all assignments before returning to PHP   Diagnosis: No Primary Diagnosis found   No diagnosis found.    Sharon Eastridge, LCSW 07/26/2014

## 2014-07-28 NOTE — Psych (Signed)
   [facility]  Sharon [name] [id number]  Session Time: 1:00 PM - 2:00 PM                                        Participation Level: Active  Behavioral Response: CasualAlertEuthymic and StrugglingWithCold  Type of Therapy: Group Therapy  Treatment Goals addressed: Anxiety, Coping and Diagnosis: MDD, Anxiety, Unspecified, Strategies to Manage Stressors  Interventions: Other: Coping-Creating a Healthy Lifestyle Including balance, Insight Oriented, CBT  Summary: Sharon [name] is a 19 y.o. female who presents with feeling sick but still not getting to the doctor. She also related that she needed assistance in getting medical documentation to her school for withdrawal. Group topic was on healthy attitudes that would be helpful in their recovery process. Patient learned strategies to use treatment effectively which includes realizing that you can't expect stressors to go away but that she can work on getting balance in here so that when life does show up you find ways to push through even when you have had difficult emotions. Also, when you feel the cloud of emotion, think about finding ways to break through the cloud such as reminding yourself that it is the cloud. Keep in mind that strategies to manage negative emotions happen through reflection and growth. Also, recognize that recovery is a life long process and an individual process. Sharon [name] actively participated in group and discussed how her negative emotions did not feel helpful for her. This was a useful group for her to learn coping strategies to manage her stressors.    Suicidal/Homicidal: No  Therapist Response: Therapist discussed using treatment to create balance in patient's life to help with better coping. Also, CBT strategies to try to challenge negative emotions and insight based interventions to have healthy expectations for recovery.   Plan: 1.Sharon [name] use treatment to help her create a healthy balance.2.Patient  use CBT strategies to help with negative emotions.        Diagnosis: Primary Diagnosis: Major depressive disorder, recurrent, severe without psychotic features [F33.2]    1. Major depressive disorder, recurrent, severe without psychotic features   2. Anxiety disorder, unspecified anxiety disorder type       Kayelyn Lemon A 07/28/2014

## 2014-07-29 ENCOUNTER — Other Ambulatory Visit (HOSPITAL_COMMUNITY): Payer: Self-pay

## 2014-07-30 ENCOUNTER — Other Ambulatory Visit (HOSPITAL_COMMUNITY): Payer: Self-pay

## 2014-07-31 ENCOUNTER — Ambulatory Visit (HOSPITAL_COMMUNITY): Payer: Self-pay | Admitting: Licensed Clinical Social Worker

## 2014-07-31 ENCOUNTER — Telehealth (HOSPITAL_COMMUNITY): Payer: Self-pay | Admitting: Licensed Clinical Social Worker
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
## 2014-08-01 ENCOUNTER — Other Ambulatory Visit (HOSPITAL_COMMUNITY): Payer: Self-pay

## 2014-08-02 ENCOUNTER — Other Ambulatory Visit (HOSPITAL_COMMUNITY): Payer: Self-pay

## 2014-08-05 ENCOUNTER — Other Ambulatory Visit (HOSPITAL_COMMUNITY): Payer: Self-pay

## 2014-08-06 ENCOUNTER — Other Ambulatory Visit (HOSPITAL_COMMUNITY): Payer: Self-pay

## 2014-08-07 ENCOUNTER — Encounter (HOSPITAL_COMMUNITY): Payer: Self-pay | Admitting: Emergency Medicine

## 2014-08-07 ENCOUNTER — Other Ambulatory Visit (HOSPITAL_COMMUNITY): Payer: Self-pay

## 2014-08-07 ENCOUNTER — Emergency Department (HOSPITAL_COMMUNITY)
Admission: EM | Admit: 2014-08-07 | Discharge: 2014-08-07 | Disposition: A | Discharge status: Home / Self Care | Payer: BLUE CROSS/BLUE SHIELD | Attending: Emergency Medicine | Admitting: Emergency Medicine

## 2014-08-07 DIAGNOSIS — R569 Unspecified convulsions: Secondary | ICD-10-CM

## 2014-08-07 DIAGNOSIS — Z3202 Encounter for pregnancy test, result negative: Secondary | ICD-10-CM | POA: Diagnosis not present

## 2014-08-07 DIAGNOSIS — Z9119 Patient's noncompliance with other medical treatment and regimen: Secondary | ICD-10-CM | POA: Insufficient documentation

## 2014-08-07 DIAGNOSIS — F329 Major depressive disorder, single episode, unspecified: Secondary | ICD-10-CM | POA: Diagnosis not present

## 2014-08-07 DIAGNOSIS — Z91040 Latex allergy status: Secondary | ICD-10-CM | POA: Insufficient documentation

## 2014-08-07 DIAGNOSIS — G40909 Epilepsy, unspecified, not intractable, without status epilepticus: Secondary | ICD-10-CM | POA: Diagnosis not present

## 2014-08-07 DIAGNOSIS — Z9114 Patient's other noncompliance with medication regimen: Secondary | ICD-10-CM

## 2014-08-07 DIAGNOSIS — F419 Anxiety disorder, unspecified: Secondary | ICD-10-CM | POA: Insufficient documentation

## 2014-08-07 DIAGNOSIS — Z79899 Other long term (current) drug therapy: Secondary | ICD-10-CM | POA: Insufficient documentation

## 2014-08-07 LAB — RAPID URINE DRUG SCREEN, HOSP PERFORMED
Amphetamines: NOT DETECTED
Barbiturates: NOT DETECTED
Benzodiazepines: NOT DETECTED
COCAINE: NOT DETECTED
OPIATES: NOT DETECTED
TETRAHYDROCANNABINOL: NOT DETECTED

## 2014-08-07 LAB — URINALYSIS, ROUTINE W REFLEX MICROSCOPIC
Bilirubin Urine: NEGATIVE
GLUCOSE, UA: NEGATIVE mg/dL
Ketones, ur: NEGATIVE mg/dL
LEUKOCYTES UA: NEGATIVE
Nitrite: NEGATIVE
PROTEIN: NEGATIVE mg/dL
Specific Gravity, Urine: 1.012 (ref 1.005–1.030)
Urobilinogen, UA: 1 mg/dL (ref 0.0–1.0)
pH: 6 (ref 5.0–8.0)

## 2014-08-07 LAB — COMPREHENSIVE METABOLIC PANEL
ALT: 10 U/L (ref 0–35)
AST: 17 U/L (ref 0–37)
Albumin: 3.6 g/dL (ref 3.5–5.2)
Alkaline Phosphatase: 51 U/L (ref 39–117)
Anion gap: 8 (ref 5–15)
BUN: 7 mg/dL (ref 6–23)
CALCIUM: 9 mg/dL (ref 8.4–10.5)
CO2: 24 mmol/L (ref 19–32)
Chloride: 108 mmol/L (ref 96–112)
Creatinine, Ser: 0.69 mg/dL (ref 0.50–1.10)
GFR calc Af Amer: >90 mL/min (ref >90)
GFR calc non Af Amer: >90 mL/min (ref >90)
GLUCOSE: 92 mg/dL (ref 70–99)
POTASSIUM: 3.7 mmol/L (ref 3.5–5.1)
SODIUM: 140 mmol/L (ref 135–145)
Total Bilirubin: 0.2 mg/dL — ABNORMAL LOW (ref 0.3–1.2)
Total Protein: 6.9 g/dL (ref 6.0–8.3)

## 2014-08-07 LAB — CBC WITH DIFFERENTIAL/PLATELET
BASOS PCT: 1 % (ref 0–1)
Basophils Absolute: 0 10*3/uL (ref 0.0–0.1)
EOS ABS: 0.1 10*3/uL (ref 0.0–0.7)
Eosinophils Relative: 2 % (ref 0–5)
HEMATOCRIT: 37 % (ref 36.0–46.0)
HEMOGLOBIN: 11.7 g/dL — AB (ref 12.0–15.0)
Lymphocytes Relative: 35 % (ref 12–46)
Lymphs Abs: 2.3 10*3/uL (ref 0.7–4.0)
MCH: 25.4 pg — AB (ref 26.0–34.0)
MCHC: 31.6 g/dL (ref 30.0–36.0)
MCV: 80.4 fL (ref 78.0–100.0)
MONO ABS: 0.5 10*3/uL (ref 0.1–1.0)
MONOS PCT: 8 % (ref 3–12)
Neutro Abs: 3.7 10*3/uL (ref 1.7–7.7)
Neutrophils Relative %: 56 % (ref 43–77)
Platelets: 262 10*3/uL (ref 150–400)
RBC: 4.6 MIL/uL (ref 3.87–5.11)
RDW: 13.5 % (ref 11.5–15.5)
WBC: 6.6 10*3/uL (ref 4.0–10.5)

## 2014-08-07 LAB — URINE MICROSCOPIC-ADD ON

## 2014-08-07 LAB — ETHANOL: ALCOHOL ETHYL (B): 92 mg/dL — AB (ref 0–9)

## 2014-08-07 LAB — PREGNANCY, URINE: PREG TEST UR: NEGATIVE

## 2014-08-07 MED ORDER — LEVETIRACETAM 750 MG PO TABS: ORAL_TABLET | ORAL | Status: DC

## 2014-08-07 MED ORDER — LEVETIRACETAM 750 MG PO TABS
1500.0000 mg | ORAL_TABLET | Freq: Once | ORAL | Status: AC
  Administered 2014-08-07: 1500 mg via ORAL
  Filled 2014-08-07: qty 2

## 2014-08-07 NOTE — ED Notes (Signed)
Removed IV from Pt left hand. Per protocol, clean dry intact, no bleeding

## 2014-08-07 NOTE — ED Provider Notes (Signed)
CSN: [id number]     Arrival date & time 08/07/14  0402 History   First MD Initiated Contact with Patient 08/07/14 0403     Chief Complaint  Patient presents with  . Seizures     (Consider location/radiation/quality/duration/timing/severity/associated sxs/prior Treatment) HPI Patient with history of seizure disorder. On Keppra but known to be noncompliant. Also with multiple psychiatric diagnoses. Presents via EMS for multiple seizures this evening. Possibly 6 prior to EMS arrival. Patient was at a friend's house when she began having avulsions. Per EMS patient was agitated and violent. He had 2 convulsive episodes in route. Required 5 mg of Versed for sedation. Patient is now drowsy and intermittently following commands. Level V caveat applies. Past Medical History  Diagnosis Date  . Anxiety   . Depression   . Seizure-like activity   . Sleep apnea     as a child and grew out of it  . Seizures    Past Surgical History  Procedure Laterality Date  . None    . No past surgeries     Family History  Problem Relation Age of Onset  . Adopted: Yes   History  Substance Use Topics  . Smoking status: Never Smoker   . Smokeless tobacco: Never Used  . Alcohol Use: 0.6 oz/week    0 Standard drinks or equivalent, 1 Shots of liquor per week     Comment: 1-2 drinks maybe 2-4 times a month   OB History    No data available     Review of Systems  Unable to perform ROS: Mental status change      Allergies  Latex; Wheat bran; Nickel; and Tape  Home Medications   Prior to Admission medications   Medication Sig Start Date End Date Taking? Authorizing Provider  citalopram (CELEXA) 20 MG tablet Take 1 tablet (20 mg total) by mouth daily. For depression 06/19/14   Sanjuana KavaAgnes I Nwoko, NP  hydrOXYzine (ATARAX/VISTARIL) 25 MG tablet Take 1 tablet (25 mg total) by mouth every 6 (six) hours as needed for anxiety. 06/19/14   Sanjuana KavaAgnes I Nwoko, NP  levETIRAcetam (KEPPRA) 750 MG tablet One in the morning  and two in the evening 08/07/14   Loren Raceravid Roxine Whittinghill, MD  levonorgestrel-ethinyl estradiol (SEASONALE,INTROVALE,JOLESSA) 0.15-0.03 MG tablet Take 1 tablet by mouth daily. For pregnancy prevention 06/19/14   Sanjuana KavaAgnes I Nwoko, NP  prazosin (MINIPRESS) 1 MG capsule Take 1 capsule (1 mg total) by mouth at bedtime. For nightmares 06/19/14   Sanjuana KavaAgnes I Nwoko, NP  QUEtiapine (SEROQUEL) 50 MG tablet Take 1 tablet (50 mg total) by mouth at bedtime. For mood control 06/19/14   Sanjuana KavaAgnes I Nwoko, NP  traZODone (DESYREL) 50 MG tablet Take 0.5 tablets (25 mg total) by mouth at bedtime and may repeat dose one time if needed. For sleep 06/19/14   Sanjuana KavaAgnes I Nwoko, NP  triamcinolone cream (KENALOG) 0.1 % Apply 1 application topically 3 (three) times daily as needed (for eczema). 06/19/14   Sanjuana KavaAgnes I Nwoko, NP   BP 99/65 mmHg  Pulse 80  Resp 16  SpO2 100% Physical Exam  Constitutional: She appears well-developed and well-nourished. No distress.  HENT:  Head: Normocephalic and atraumatic.  Mouth/Throat: Oropharynx is clear and moist.  No intraoral trauma  Eyes: EOM are normal. Pupils are equal, round, and reactive to light.  Neck: Normal range of motion. Neck supple.  Cardiovascular: Normal rate and regular rhythm.   Pulmonary/Chest: Effort normal and breath sounds normal. No respiratory distress. She has no wheezes.  She has no rales. She exhibits no tenderness.  Abdominal: Soft. Bowel sounds are normal. She exhibits no distension and no mass. There is no tenderness. There is no rebound and no guarding.  Musculoskeletal: Normal range of motion. She exhibits no edema or tenderness.  Neurological:  Patient intermittently following commands. Appears to be moving all extremities. Nonverbal at this time.  Skin: Skin is warm and dry. No rash noted. No erythema.  Nursing note and vitals reviewed.   ED Course  Procedures (including critical care time) Labs Review Labs Reviewed  CBC WITH DIFFERENTIAL/PLATELET - Abnormal; Notable for the  following:    Hemoglobin 11.7 (*)    MCH 25.4 (*)    All other components within normal limits  COMPREHENSIVE METABOLIC PANEL - Abnormal; Notable for the following:    Total Bilirubin 0.2 (*)    All other components within normal limits  URINALYSIS, ROUTINE W REFLEX MICROSCOPIC - Abnormal; Notable for the following:    Hgb urine dipstick SMALL (*)    All other components within normal limits  ETHANOL - Abnormal; Notable for the following:    Alcohol, Ethyl (B) 92 (*)    All other components within normal limits  URINE MICROSCOPIC-ADD ON - Abnormal; Notable for the following:    Squamous Epithelial / LPF FEW (*)    All other components within normal limits  PREGNANCY, URINE  URINE RAPID DRUG SCREEN (HOSP PERFORMED)    Imaging Review No results found.   EKG Interpretation   Date/Time:  Wednesday August 07 2014 04:12:41 EDT Ventricular Rate:  99 PR Interval:  130 QRS Duration: 86 QT Interval:  332 QTC Calculation: 426 R Axis:   101 Text Interpretation:  Sinus rhythm Borderline right axis deviation  Confirmed by Ranae Palms  MD, Prakriti Carignan ([id number]) on 08/07/2014 4:47:19 AM      MDM   Final diagnoses:  Seizure  Noncompliance with medication regimen    Patient is alert and oriented. She has a normal neurologic exam. She admits to not taking her Keppra for the past 2 weeks. Loaded in the emergency department and will give prescription for the same. Patient is advised to follow-up with her neurologist. Return precautions given.   Loren Racer, MD 08/07/14 [phone]

## 2014-08-07 NOTE — ED Notes (Signed)
PT ambulated with baseline gait; VSS; A&Ox3; no signs of distress; respirations even and unlabored; skin warm and dry; no questions upon discharge.  

## 2014-08-07 NOTE — Discharge Instructions (Signed)

## 2014-08-07 NOTE — ED Notes (Signed)
Patient with history of seizure disorder, patient with ETOH on board.  Patient with multiple seizures this evening, reported 10 seizures this evening.  Patient was given 5mg  Versed en route to ED.  Patient has history of combative due her post ictal state.

## 2014-08-08 ENCOUNTER — Other Ambulatory Visit (HOSPITAL_COMMUNITY): Payer: Self-pay

## 2014-08-09 ENCOUNTER — Other Ambulatory Visit (HOSPITAL_COMMUNITY): Payer: Self-pay

## 2014-08-12 ENCOUNTER — Other Ambulatory Visit (HOSPITAL_COMMUNITY): Payer: Self-pay

## 2014-08-13 ENCOUNTER — Other Ambulatory Visit (HOSPITAL_COMMUNITY): Payer: Self-pay

## 2014-08-14 ENCOUNTER — Other Ambulatory Visit (HOSPITAL_COMMUNITY): Payer: Self-pay

## 2014-08-15 ENCOUNTER — Other Ambulatory Visit (HOSPITAL_COMMUNITY): Payer: Self-pay

## 2014-08-16 ENCOUNTER — Other Ambulatory Visit (HOSPITAL_COMMUNITY): Payer: Self-pay

## 2014-08-19 ENCOUNTER — Other Ambulatory Visit (HOSPITAL_COMMUNITY): Payer: Self-pay

## 2014-08-20 ENCOUNTER — Ambulatory Visit (HOSPITAL_COMMUNITY): Payer: Self-pay | Admitting: Psychology

## 2014-08-20 ENCOUNTER — Other Ambulatory Visit (HOSPITAL_COMMUNITY): Payer: Self-pay

## 2014-08-20 ENCOUNTER — Encounter (HOSPITAL_COMMUNITY): Payer: Self-pay | Admitting: Psychology

## 2014-08-20 NOTE — Progress Notes (Signed)
Izell CarolinaKimberly Kugler is a 19 y.o. female patient who [facility]  Pt didn't show for her appointment.  No show letter sent.        Forde RadonYATES,Deshara Rossi, LPC

## 2014-08-21 ENCOUNTER — Other Ambulatory Visit (HOSPITAL_COMMUNITY): Payer: Self-pay

## 2014-08-22 ENCOUNTER — Other Ambulatory Visit (HOSPITAL_COMMUNITY): Payer: Self-pay

## 2014-08-23 ENCOUNTER — Other Ambulatory Visit (HOSPITAL_COMMUNITY): Payer: Self-pay

## 2014-08-26 ENCOUNTER — Other Ambulatory Visit (HOSPITAL_COMMUNITY): Payer: Self-pay

## 2014-08-27 ENCOUNTER — Other Ambulatory Visit (HOSPITAL_COMMUNITY): Payer: Self-pay

## 2014-08-28 ENCOUNTER — Other Ambulatory Visit (HOSPITAL_COMMUNITY): Payer: Self-pay

## 2014-09-05 ENCOUNTER — Ambulatory Visit (HOSPITAL_COMMUNITY): Payer: Self-pay | Admitting: Psychiatry

## 2014-09-10 ENCOUNTER — Encounter (HOSPITAL_COMMUNITY): Payer: Self-pay | Admitting: Licensed Clinical Social Worker

## 2014-09-20 ENCOUNTER — Telehealth (HOSPITAL_COMMUNITY): Payer: Self-pay | Admitting: Licensed Clinical Social Worker

## 2014-11-27 NOTE — Progress Notes (Signed)
No encounter

## 2015-01-23 ENCOUNTER — Emergency Department (HOSPITAL_COMMUNITY): Payer: Self-pay

## 2015-01-23 ENCOUNTER — Emergency Department (HOSPITAL_COMMUNITY)
Admission: EM | Admit: 2015-01-23 | Discharge: 2015-01-24 | Disposition: A | Discharge status: Home / Self Care | Payer: Self-pay | Attending: Emergency Medicine | Admitting: Emergency Medicine

## 2015-01-23 ENCOUNTER — Encounter (HOSPITAL_COMMUNITY): Payer: Self-pay | Admitting: Emergency Medicine

## 2015-01-23 ENCOUNTER — Other Ambulatory Visit: Payer: Self-pay

## 2015-01-23 DIAGNOSIS — R000 Tachycardia, unspecified: Secondary | ICD-10-CM | POA: Insufficient documentation

## 2015-01-23 DIAGNOSIS — R079 Chest pain, unspecified: Secondary | ICD-10-CM | POA: Insufficient documentation

## 2015-01-23 DIAGNOSIS — R0602 Shortness of breath: Secondary | ICD-10-CM | POA: Insufficient documentation

## 2015-01-23 DIAGNOSIS — Z91040 Latex allergy status: Secondary | ICD-10-CM | POA: Insufficient documentation

## 2015-01-23 DIAGNOSIS — Z3202 Encounter for pregnancy test, result negative: Secondary | ICD-10-CM | POA: Insufficient documentation

## 2015-01-23 DIAGNOSIS — Z8669 Personal history of other diseases of the nervous system and sense organs: Secondary | ICD-10-CM | POA: Insufficient documentation

## 2015-01-23 DIAGNOSIS — Z8659 Personal history of other mental and behavioral disorders: Secondary | ICD-10-CM | POA: Insufficient documentation

## 2015-01-23 LAB — CBC WITH DIFFERENTIAL/PLATELET
Basophils Absolute: 0 10*3/uL (ref 0.0–0.1)
Basophils Relative: 0 %
Eosinophils Absolute: 0 10*3/uL (ref 0.0–0.7)
Eosinophils Relative: 0 %
HCT: 39 % (ref 36.0–46.0)
Hemoglobin: 12.2 g/dL (ref 12.0–15.0)
Lymphocytes Relative: 14 %
Lymphs Abs: 1.4 10*3/uL (ref 0.7–4.0)
MCH: 25.6 pg — ABNORMAL LOW (ref 26.0–34.0)
MCHC: 31.3 g/dL (ref 30.0–36.0)
MCV: 81.9 fL (ref 78.0–100.0)
Monocytes Absolute: 1.1 10*3/uL — ABNORMAL HIGH (ref 0.1–1.0)
Monocytes Relative: 11 %
Neutro Abs: 7.4 10*3/uL (ref 1.7–7.7)
Neutrophils Relative %: 75 %
Platelets: 219 10*3/uL (ref 150–400)
RBC: 4.76 MIL/uL (ref 3.87–5.11)
RDW: 13.6 % (ref 11.5–15.5)
WBC: 9.9 10*3/uL (ref 4.0–10.5)

## 2015-01-23 LAB — COMPREHENSIVE METABOLIC PANEL
ALT: 17 U/L (ref 14–54)
AST: 20 U/L (ref 15–41)
Albumin: 3.8 g/dL (ref 3.5–5.0)
Alkaline Phosphatase: 54 U/L (ref 38–126)
Anion gap: 8 (ref 5–15)
BUN: 10 mg/dL (ref 6–20)
CO2: 26 mmol/L (ref 22–32)
Calcium: 9.4 mg/dL (ref 8.9–10.3)
Chloride: 103 mmol/L (ref 101–111)
Creatinine, Ser: 0.7 mg/dL (ref 0.44–1.00)
GFR calc Af Amer: >60 mL/min (ref >60)
GFR calc non Af Amer: >60 mL/min (ref >60)
Glucose, Bld: 84 mg/dL (ref 65–99)
Potassium: 3.7 mmol/L (ref 3.5–5.1)
Sodium: 137 mmol/L (ref 135–145)
Total Bilirubin: 0.4 mg/dL (ref 0.3–1.2)
Total Protein: 7.2 g/dL (ref 6.5–8.1)

## 2015-01-23 LAB — POC URINE PREG, ED: Preg Test, Ur: NEGATIVE

## 2015-01-23 MED ORDER — KETOROLAC TROMETHAMINE 15 MG/ML IJ SOLN
15.0000 mg | Freq: Once | INTRAMUSCULAR | Status: AC
  Administered 2015-01-23: 15 mg via INTRAVENOUS
  Filled 2015-01-23: qty 1

## 2015-01-23 MED ORDER — MORPHINE SULFATE (PF) 4 MG/ML IV SOLN
4.0000 mg | Freq: Once | INTRAVENOUS | Status: AC
  Administered 2015-01-23: 4 mg via INTRAVENOUS
  Filled 2015-01-23: qty 1

## 2015-01-23 MED ORDER — SODIUM CHLORIDE 0.9 % IV BOLUS (SEPSIS)
1000.0000 mL | Freq: Once | INTRAVENOUS | Status: AC
  Administered 2015-01-23: 1000 mL via INTRAVENOUS

## 2015-01-23 NOTE — ED Notes (Signed)
Pt. reports intermittent  anterior neck discomfort " throbbing" with mild SOB onset this week , denies cough or congestion . Denies chest pain .

## 2015-01-23 NOTE — ED Notes (Signed)
EDP advised EMT that pt. needs immediate room placement due to EKG changes.

## 2015-01-23 NOTE — ED Provider Notes (Signed)
CSN: [id number]     Arrival date & time 01/23/15  1951 History   First MD Initiated Contact with Patient 01/23/15 2030     Chief Complaint  Patient presents with  . Neck Pain  . Shortness of Breath     (Consider location/radiation/quality/duration/timing/severity/associated sxs/prior Treatment) HPI   19 year old female with chest pain. Patient describes as pressure/throbbing in mid to upper sternal region. Constant. Worse with deep breathing, coughing and laughing. Mild shortness of breath. No history of similar type pain. No fevers or chills. No unusual leg pain or swelling. No history of similar symptoms. No history DVT/PE. Birth control on med list but patient says not currently taking. Has tired taking ibuprofen with minimal relief.  Past Medical History  Diagnosis Date  . Anxiety   . Depression   . Seizure-like activity (HCC)   . Sleep apnea     as a child and grew out of it  . Seizures [facility])    Past Surgical History  Procedure Laterality Date  . None    . No past surgeries     Family History  Problem Relation Age of Onset  . Adopted: Yes   Social History  Substance Use Topics  . Smoking status: Never Smoker   . Smokeless tobacco: Never Used  . Alcohol Use: Yes   OB History    No data available     Review of Systems  All systems reviewed and negative, other than as noted in HPI.   Allergies  Latex; Wheat bran; Nickel; and Tape  Home Medications   Prior to Admission medications   Medication Sig Start Date End Date Taking? Authorizing Provider  citalopram (CELEXA) 20 MG tablet Take 1 tablet (20 mg total) by mouth daily. For depression Patient not taking: Reported on 01/23/2015 06/19/14   Sanjuana Kava, NP  hydrOXYzine (ATARAX/VISTARIL) 25 MG tablet Take 1 tablet (25 mg total) by mouth every 6 (six) hours as needed for anxiety. Patient not taking: Reported on 01/23/2015 06/19/14   Sanjuana Kava, NP  levETIRAcetam (KEPPRA) 750 MG tablet One in the morning and two  in the evening Patient not taking: Reported on 01/23/2015 08/07/14   Loren Racer, MD  levonorgestrel-ethinyl estradiol (SEASONALE,INTROVALE,JOLESSA) 0.15-0.03 MG tablet Take 1 tablet by mouth daily. For pregnancy prevention Patient not taking: Reported on 01/23/2015 06/19/14   Sanjuana Kava, NP  prazosin (MINIPRESS) 1 MG capsule Take 1 capsule (1 mg total) by mouth at bedtime. For nightmares Patient not taking: Reported on 01/23/2015 06/19/14   Sanjuana Kava, NP  QUEtiapine (SEROQUEL) 50 MG tablet Take 1 tablet (50 mg total) by mouth at bedtime. For mood control Patient not taking: Reported on 01/23/2015 06/19/14   Sanjuana Kava, NP  traZODone (DESYREL) 50 MG tablet Take 0.5 tablets (25 mg total) by mouth at bedtime and may repeat dose one time if needed. For sleep Patient not taking: Reported on 01/23/2015 06/19/14   Sanjuana Kava, NP  triamcinolone cream (KENALOG) 0.1 % Apply 1 application topically 3 (three) times daily as needed (for eczema). Patient not taking: Reported on 01/23/2015 06/19/14   Sanjuana Kava, NP   BP 106/69 mmHg  Pulse 127  Temp(Src) 98.2 F (36.8 C) (Oral)  Resp 16  SpO2 99%  LMP 01/13/2015 Physical Exam  Constitutional: She appears well-developed and well-nourished. No distress.  HENT:  Head: Normocephalic and atraumatic.  Eyes: Conjunctivae are normal. Right eye exhibits no discharge. Left eye exhibits no discharge.  Neck: Neck  supple.  Cardiovascular: Regular rhythm and normal heart sounds.  Exam reveals no gallop and no friction rub.   No murmur heard. Tachycardic  Pulmonary/Chest: Effort normal and breath sounds normal. No respiratory distress.  Abdominal: Soft. She exhibits no distension. There is no tenderness.  Musculoskeletal: She exhibits no edema or tenderness.  Lower extremities symmetric as compared to each other. No calf tenderness. Negative Homan's. No palpable cords.   Neurological: She is alert.  Skin: Skin is warm and dry.  Psychiatric: She has a normal  mood and affect. Her behavior is normal. Thought content normal.  Nursing note and vitals reviewed.   ED Course  Procedures (including critical care time) Labs Review Labs Reviewed  CBC WITH DIFFERENTIAL/PLATELET - Abnormal; Notable for the following:    MCH 25.6 (*)    Monocytes Absolute 1.1 (*)    All other components within normal limits  COMPREHENSIVE METABOLIC PANEL  POC URINE PREG, ED    Imaging Review Dg Chest 2 View  01/23/2015   CLINICAL DATA:  Intermittent anterior neck discomfort " throbbing" with mild SOB onset this week , denies cough or congestion . Denies chest pain. Nonsmoker  EXAM: CHEST - 2 VIEW  COMPARISON:  02/28/2014  FINDINGS: Lungs are clear. Heart size and mediastinal contours are within normal limits. No effusion.  No pneumothorax. Visualized skeletal structures are unremarkable.  IMPRESSION: No acute cardiopulmonary disease.   Electronically Signed   By: Corlis Leak M.D.   On: 01/23/2015 21:04   I have personally reviewed and evaluated these images and lab results as part of my medical decision-making.   EKG Interpretation   Date/Time:  Thursday January 23 2015 21:21:38 EDT Ventricular Rate:  104 PR Interval:  133 QRS Duration: 87 QT Interval:  321 QTC Calculation: 422 R Axis:   81 Text Interpretation:  Sinus tachycardia Non-specific ST-t changes  Confirmed by Juleen China  MD, Josephyne Tarter ([id number]) on 01/23/2015 10:27:21 PM      MDM   Final diagnoses:  Chest pain, unspecified chest pain type    19 year old female with pleuritic chest pain. Mild shortness of breath. Oxygen saturations are normal on room air. She has resting tachycardia which I do not have a good explanation for. She is afebrile. Does not appear distressed. Aside from the tachycardia, she is hemodynamically stable. Initial workup unremarkable. Doubt ACS. Some concern for possible pulmonary embolism. Will CT. Consider pericarditis. Doesn't seem positional though. No rub appreciated. No recent  preceding URI symptoms. If CT doesn't show any concerning pathology feel that patient can be discharged for continued symptomatic treatment with NSAIDs and return precautions.     Raeford Razor, MD 01/25/15 0120

## 2015-01-24 ENCOUNTER — Emergency Department (HOSPITAL_COMMUNITY): Payer: BLUE CROSS/BLUE SHIELD

## 2015-01-24 ENCOUNTER — Encounter (HOSPITAL_COMMUNITY): Payer: Self-pay | Admitting: Radiology

## 2015-01-24 MED ORDER — IOHEXOL 350 MG/ML SOLN
100.0000 mL | Freq: Once | INTRAVENOUS | Status: AC | PRN
  Administered 2015-01-24: 100 mL via INTRAVENOUS

## 2015-01-24 NOTE — Discharge Instructions (Signed)
Nonspecific Chest Pain  °Chest pain can be caused by many different conditions. There is always a chance that your pain could be related to something serious, such as a heart attack or a blood clot in your lungs. Chest pain can also be caused by conditions that are not life-threatening. If you have chest pain, it is very important to follow up with your health care provider. °CAUSES  °Chest pain can be caused by: °· Heartburn. °· Pneumonia or bronchitis. °· Anxiety or stress. °· Inflammation around your heart (pericarditis) or lung (pleuritis or pleurisy). °· A blood clot in your lung. °· A collapsed lung (pneumothorax). It can develop suddenly on its own (spontaneous pneumothorax) or from trauma to the chest. °· Shingles infection (varicella-zoster virus). °· Heart attack. °· Damage to the bones, muscles, and cartilage that make up your chest wall. This can include: °¨ Bruised bones due to injury. °¨ Strained muscles or cartilage due to frequent or repeated coughing or overwork. °¨ Fracture to one or more ribs. °¨ Sore cartilage due to inflammation (costochondritis). °RISK FACTORS  °Risk factors for chest pain may include: °· Activities that increase your risk for trauma or injury to your chest. °· Respiratory infections or conditions that cause frequent coughing. °· Medical conditions or overeating that can cause heartburn. °· Heart disease or family history of heart disease. °· Conditions or health behaviors that increase your risk of developing a blood clot. °· Having had chicken pox (varicella zoster). °SIGNS AND SYMPTOMS °Chest pain can feel like: °· Burning or tingling on the surface of your chest or deep in your chest. °· Crushing, pressure, aching, or squeezing pain. °· Dull or sharp pain that is worse when you move, cough, or take a deep breath. °· Pain that is also felt in your back, neck, shoulder, or arm, or pain that spreads to any of these areas. °Your chest pain may come and go, or it may stay  constant. °DIAGNOSIS °Lab tests or other studies may be needed to find the cause of your pain. Your health care provider may have you take a test called an ambulatory ECG (electrocardiogram). An ECG records your heartbeat patterns at the time the test is performed. You may also have other tests, such as: °· Transthoracic echocardiogram (TTE). During echocardiography, sound waves are used to create a picture of all of the heart structures and to look at how blood flows through your heart. °· Transesophageal echocardiogram (TEE). This is a more advanced imaging test that obtains images from inside your body. It allows your health care provider to see your heart in finer detail. °· Cardiac monitoring. This allows your health care provider to monitor your heart rate and rhythm in real time. °· Holter monitor. This is a portable device that records your heartbeat and can help to diagnose abnormal heartbeats. It allows your health care provider to track your heart activity for several days, if needed. °· Stress tests. These can be done through exercise or by taking medicine that makes your heart beat more quickly. °· Blood tests. °· Imaging tests. °TREATMENT  °Your treatment depends on what is causing your chest pain. Treatment may include: °· Medicines. These may include: °¨ Acid blockers for heartburn. °¨ Anti-inflammatory medicine. °¨ Pain medicine for inflammatory conditions. °¨ Antibiotic medicine, if an infection is present. °¨ Medicines to dissolve blood clots. °¨ Medicines to treat coronary artery disease. °· Supportive care for conditions that do not require medicines. This may include: °¨ Resting. °¨ Applying heat   or cold packs to injured areas. °¨ Limiting activities until pain decreases. °HOME CARE INSTRUCTIONS °· If you were prescribed an antibiotic medicine, finish it all even if you start to feel better. °· Avoid any activities that bring on chest pain. °· Do not use any tobacco products, including  cigarettes, chewing tobacco, or electronic cigarettes. If you need help quitting, ask your health care provider. °· Do not drink alcohol. °· Take medicines only as directed by your health care provider. °· Keep all follow-up visits as directed by your health care provider. This is important. This includes any further testing if your chest pain does not go away. °· If heartburn is the cause for your chest pain, you may be told to keep your head raised (elevated) while sleeping. This reduces the chance that acid will go from your stomach into your esophagus. °· Make lifestyle changes as directed by your health care provider. These may include: °¨ Getting regular exercise. Ask your health care provider to suggest some activities that are safe for you. °¨ Eating a heart-healthy diet. A registered dietitian can help you to learn healthy eating options. °¨ Maintaining a healthy weight. °¨ Managing diabetes, if necessary. °¨ Reducing stress. °SEEK MEDICAL CARE IF: °· Your chest pain does not go away after treatment. °· You have a rash with blisters on your chest. °· You have a fever. °SEEK IMMEDIATE MEDICAL CARE IF:  °· Your chest pain is worse. °· You have an increasing cough, or you cough up blood. °· You have severe abdominal pain. °· You have severe weakness. °· You faint. °· You have chills. °· You have sudden, unexplained chest discomfort. °· You have sudden, unexplained discomfort in your arms, back, neck, or jaw. °· You have shortness of breath at any time. °· You suddenly start to sweat, or your skin gets clammy. °· You feel nauseous or you vomit. °· You suddenly feel light-headed or dizzy. °· Your heart begins to beat quickly, or it feels like it is skipping beats. °These symptoms may represent a serious problem that is an emergency. Do not wait to see if the symptoms will go away. Get medical help right away. Call your local emergency services (911 in the [place] Do not drive yourself to the hospital. °  °This  information is not intended to replace advice given to you by your health care provider. Make sure you discuss any questions you have with your health care provider. °  °Document Released: 01/13/2005 Document Revised: 04/26/2014 Document Reviewed: 11/09/2013 °Elsevier Interactive Patient Education ©2016 [employer]vier Inc[employer] ° ° °Emergency Department Resource Guide °1) Find a Doctor and Pay Out of Pocket °Although you won't have to find out who is covered by your insurance plan, it is a good idea to ask around and get recommendations. You will then need to call the office and see if the doctor you have chosen will accept you as a new patient and what types of options they offer for patients who are self-pay. Some doctors offer discounts or will set up payment plans for their patients who do not have insurance, but you will need to ask so you aren't surprised when you get to your appointment. ° °2) Contact Your Local Health Department °Not all health departments have doctors that can see patients for sick visits, but many do, so it is worth a call to see if yours does. If you don't know where your local health department is, you can check in your phone book.   The CDC also has a tool to help you locate your state's health department, and many state websites also have listings of all of their local health departments. ° °3) Find a Walk-in Clinic °If your illness is not likely to be very severe or complicated, you may want to try a walk in clinic. These are popping up all over the country in pharmacies, drugstores, and shopping centers. They're usually staffed by nurse practitioners or physician assistants that have been trained to treat common illnesses and complaints. They're usually fairly quick and inexpensive. However, if you have serious medical issues or chronic medical problems, these are probably not your best option. ° °No Primary Care Doctor: °- Call [facility] at  832-8000 - they can help you locate a primary  care doctor that  accepts your insurance, provides certain services, etc. °- Physician Referral Service- 1-800-533-3463 ° °Chronic Pain Problems: °Organization         Address  Phone   Notes  °[facility]  (336) 297-2271 Patients need to be referred by their primary care doctor.  ° °Medication Assistance: °Organization         Address  Phone   Notes  °[place] [facility] [address] °[place], [place] [address] (336) 641-8030 --Must be a resident of [place] °-- Must have NO insurance coverage whatsoever (no Medicaid/ Medicare, etc.) °-- The pt. MUST have a primary care doctor that directs their care regularly and follows them in the community °  °[facility]  (866) 331-1348   °[employer]  (888) 892-1162   ° °Agencies that provide inexpensive medical care: °Organization         Address  Phone   Notes  °[facility]  (336) 832-8035   °[facility]    (336) 832-7272   °[facility] [address] °[place], [place] [address] (336) 832-4777   °[facility] [address], °[place] (336) 271-4999   °[facility]    (336) 373-0678   °[facility]    (336) 272-1050   °[facility] ° [address], [place] Phone:  (336) 832-4444, Fax:  (336) 832-4440 Hours of Operation:  9 am - 6 pm, M-F.  Also accepts Medicaid/Medicare and self-pay.  °[facility] ° [address], [place] Phone: (336) [phone], Fax: (336) [phone]. Hours of Operation:  8:30 am - 5:30 pm, M-F.  Also accepts Medicaid and self-pay.  °[facility] [place] [address], [place] Phone: (336) 878-6027   °[facility] [address], [place],  (336)723-1848, Ext. 123 Mondays & Thursdays: 7-9 AM.  First 15 patients are seen on a first come, first serve basis. °  ° °Medicaid-accepting [place]  Providers: ° °Organization         Address  Phone   Notes  °[facility] [address], [place] (336) 641-2100 Also accepts self-pay patients.  °[facility] [address], [place] ° (336) 856-9996   °[facility] [address], [place] (336) 288-8857   °[facility] [address] [place] [address], [place] (336) 299-7000   °Veita Bland [address], [place]  ° (336) [phone] Only accepts [place] Access Medicaid patients after they have their name applied to their card.  ° °Self-Pay (  no insurance) in [place]: ° °Organization         Address  Phone   Notes  °Sickle Cell Patients, [facility] [address], [place] (336) 832-1970   °[facility] [address], [place] (336) 832-4400   °[facility] [place] ° [address], [place] (336) 992-4800   °[facility]/Dr. Osei-Bonsu ° [address] [place] [address], [place] or [address], [place] (336) [phone] Phone number for both [place] and [place] locations is the same.  °[facility] [address], [place] (336) 299-0000   °[facility] [address] [place] [address], [place] or [address] (336) 852-7530 °(336) 878-2260   °[facility] [address], [place] (336) 350-1642, phone; (336) 294-5005, fax Sees patients 1st and 3rd Saturday of every month.  Must not qualify for public or private insurance (i.e. Medicaid, Medicare, [place] Health Choice, Veterans' Benefits) • Household income should be no more than 200% of the poverty level •The clinic cannot treat you if you are pregnant or think you are pregnant • Sexually transmitted diseases are not treated at the clinic.  ° ° °Dental Care: °Organization         Address  Phone  Notes  °[facility] [address], [place] (336) 641-6152 Accepts children up to age 21 who are enrolled in Medicaid or [place] Health Choice; pregnant women with a Medicaid card; and children who have applied for Medicaid or [place] Health Choice, but were declined, whose parents can pay a reduced fee at time of service.  °[facility] [place]  [address], [place] (336) 641-7733 Accepts children up to age 21 who are enrolled in Medicaid or [place] Health Choice; pregnant women with a Medicaid card; and children who have applied for Medicaid or [place] Health Choice, but were declined, whose parents can pay a reduced fee at time of service.  °[facility] ° [address], [place] (336) 641-4533 Patients are seen by appointment only. Walk-ins are not accepted. [facility] will see patients 18 years of age and older. °Monday - Tuesday (8am-5pm) °Most Wednesdays (8:30-5pm) °$30 per visit, cash only  °[facility] ° [address], [place] (336) [phone] Patients are seen by appointment only. Walk-ins are not accepted. [facility] will see patients 18 years of age and older. °One Wednesday Evening (Monthly: Volunteer Based).  $30 per visit, cash only  °[facility]  (919) [phone] for adults; Children under age 4, call Graduate Pediatric Dentistry at ([phone]) [phone]. Children aged 4-14, please call ([phone]) [phone] to request a pediatric application. ° Dental services are provided in all areas of dental care including fillings, crowns and bridges, complete and partial dentures, implants, gum treatment, root canals, and extractions. Preventive care is also provided. Treatment is provided to both adults and children. °Patients are selected via a lottery and there is often a waiting list. °  °[facility] [address], ° ° (336) 763-8833 [email or web address] °  °[facility]  [address], [place], [place] (336)723-1848, Ext. 123 Second and Fourth Thursday of each month, opens at 6:30 AM; Clinic ends at 9 AM.  Patients are seen on a first-come first-served basis, and a limited number are   seen during each clinic.  ° °[facility] ° [address], [place], [place] (336) 723-7904   Eligibility Requirements °You must have lived in [place], [place], or [place] counties for at least the last three months. °  You cannot be eligible for state or federal sponsored healthcare insurance, including Veterans Administration, Medicaid, or Medicare. °  You generally cannot be eligible for healthcare insurance through your employer.  °  How to apply: °Eligibility screenings are held every Tuesday and Wednesday afternoon from 1:00 pm until 4:00 pm. You do not need an appointment for the interview!  °[facility] [address], [place], [place] 336-631-2330   °[facility]  336-342-8273   °[facility]  336-703-3100   °[facility]  336-570-6415   ° °Behavioral Health Resources in the Community: °Intensive Outpatient Programs °Organization         Address  Phone  Notes  °[facility] [address], [place], [place] 336-878-6098   °[facility] [address], [place], [place] 336-832-9800   °[facility] [address], [place], [place] ° 336-882-2125   °[facility] [address],  °[place], [place] 1-800-853-5163 or 336-641-4981   °Substance Abuse Resources °Organization         Address  Phone  Notes  °[facility]  336-882-2125   °[facility]  336-784-9470   °[facility]  336-285-9073   °[facility]  336-845-3988   °Residential & Outpatient Substance Abuse Program  1-800-659-3381   °Psychological Services °Organization         Address  Phone  Notes  °[facility]  336- 832-9600    °[facility]  336- 378-7881   °[facility] [address], [place] 1-800-853-5163 or 336-641-4981   ° °Mobile Crisis Teams °Organization         Address  Phone  Notes  °[facility], Mobile Crisis Care Unit  1-877-626-1772   °Assertive °[facility] ° [address] [place], [place] 336-834-9664   °Sharon DeEsch [address] °[place] 336-554-5454   ° °Self-Help/Support Groups °Organization         Address  Phone             Notes  °[facility] - variety of support groups  336- 373-1402 Call for more information  °Narcotics Anonymous (NA), [facility] [address], °[place]  2 meetings at this location  ° °Residential Treatment Programs °Organization         Address  Phone  Notes  °[facility] [address],    °[place]  1-866-801-8205   °[facility] ° [address], [place], [place] 704-293-8524   °[facility] [address], [place] 336-845-3988 Admissions: 8am-3pm M-F  °[facility] [address]    °[place], [place] 336-841-1104   °[facility] [address], [place], [place] 336-379-7146   °[facility] [address]  °[place], [place] 336-285-9073   °[facility] - Intensive Outpatient [address], [place], [place] 336-852-3033   °[facility] [address]  °[place], [place] 1-877-615-2722 or 336-784-9470   °[facility] [address] [place],  336-227-7417 Accepts Medicaid  °[facility] [address]  °  [place] 1-800-659-3381 Substance Abuse/Addiction Treatment  ° °[place] Behavioral Health Resources °Organization         Address  Phone  Notes  °[facility]  (888) 581-9988   °Julie Brannon, PhD [address] [place], [place]   (336) 349-5553 or (336) 951-0000    °[facility]   [address] °[place], [place] (336) 349-4454   °[facility] [address], [place], [place] (336) 342-8316 Insurance/Medicaid/sponsorship through [facility]  °[facility] [address]                                    [place], [place] (336) 342-8316 Therapy/tele-psych/case  °[facility] [address]  ° [place],  (336) 349-2233    °Dr. Arfeen  (336) 349-4544   °[facility] [place]  [employer] [place] [facility] 1) [address],  °2) [address], [place] °3)  [address], [place] (336) 349-3220 °(336) 342-7768 ° °(336) 342-8140   °[place] [facility] (336) 342-1394 or (336) 342-3537 (After Hours)    ° ° ° °

## 2015-01-24 NOTE — ED Provider Notes (Signed)
Signed out by Dr Juleen China at [facility] that ct is pending, and that d/c instructions are done/printed, to d/c to home if/when ct angio chest neg.  Ct chest negative per radiologist.   Recheck, pt comfortable, nad.    Cathren Laine, MD 01/24/15 [phone]

## 2015-02-14 ENCOUNTER — Emergency Department (HOSPITAL_COMMUNITY)
Admission: EM | Admit: 2015-02-14 | Discharge: 2015-02-14 | Disposition: A | Discharge status: Home / Self Care | Payer: Self-pay | Attending: Emergency Medicine | Admitting: Emergency Medicine

## 2015-02-14 ENCOUNTER — Emergency Department (HOSPITAL_COMMUNITY): Payer: BLUE CROSS/BLUE SHIELD

## 2015-02-14 ENCOUNTER — Encounter (HOSPITAL_COMMUNITY): Payer: Self-pay

## 2015-02-14 DIAGNOSIS — Y998 Other external cause status: Secondary | ICD-10-CM | POA: Insufficient documentation

## 2015-02-14 DIAGNOSIS — Y9389 Activity, other specified: Secondary | ICD-10-CM | POA: Insufficient documentation

## 2015-02-14 DIAGNOSIS — G40909 Epilepsy, unspecified, not intractable, without status epilepticus: Secondary | ICD-10-CM | POA: Insufficient documentation

## 2015-02-14 DIAGNOSIS — Z91040 Latex allergy status: Secondary | ICD-10-CM | POA: Insufficient documentation

## 2015-02-14 DIAGNOSIS — R4182 Altered mental status, unspecified: Secondary | ICD-10-CM | POA: Insufficient documentation

## 2015-02-14 DIAGNOSIS — F419 Anxiety disorder, unspecified: Secondary | ICD-10-CM | POA: Insufficient documentation

## 2015-02-14 DIAGNOSIS — Z041 Encounter for examination and observation following transport accident: Secondary | ICD-10-CM | POA: Insufficient documentation

## 2015-02-14 DIAGNOSIS — R404 Transient alteration of awareness: Secondary | ICD-10-CM

## 2015-02-14 DIAGNOSIS — F329 Major depressive disorder, single episode, unspecified: Secondary | ICD-10-CM | POA: Insufficient documentation

## 2015-02-14 DIAGNOSIS — Y92410 Unspecified street and highway as the place of occurrence of the external cause: Secondary | ICD-10-CM | POA: Insufficient documentation

## 2015-02-14 LAB — BASIC METABOLIC PANEL
ANION GAP: 9 (ref 5–15)
BUN: 9 mg/dL (ref 6–20)
CHLORIDE: 105 mmol/L (ref 101–111)
CO2: 25 mmol/L (ref 22–32)
Calcium: 9.3 mg/dL (ref 8.9–10.3)
Creatinine, Ser: 0.75 mg/dL (ref 0.44–1.00)
GFR calc Af Amer: >60 mL/min (ref >60)
GLUCOSE: 95 mg/dL (ref 65–99)
POTASSIUM: 3.9 mmol/L (ref 3.5–5.1)
SODIUM: 139 mmol/L (ref 135–145)

## 2015-02-14 LAB — CBC WITH DIFFERENTIAL/PLATELET
BASOS ABS: 0 10*3/uL (ref 0.0–0.1)
Basophils Relative: 0 %
EOS PCT: 0 %
Eosinophils Absolute: 0 10*3/uL (ref 0.0–0.7)
HCT: 35.7 % — ABNORMAL LOW (ref 36.0–46.0)
HEMOGLOBIN: 11.4 g/dL — AB (ref 12.0–15.0)
LYMPHS ABS: 1.5 10*3/uL (ref 0.7–4.0)
Lymphocytes Relative: 26 %
MCH: 25.8 pg — ABNORMAL LOW (ref 26.0–34.0)
MCHC: 31.9 g/dL (ref 30.0–36.0)
MCV: 80.8 fL (ref 78.0–100.0)
Monocytes Absolute: 0.5 10*3/uL (ref 0.1–1.0)
Monocytes Relative: 9 %
NEUTROS PCT: 65 %
Neutro Abs: 3.7 10*3/uL (ref 1.7–7.7)
PLATELETS: 222 10*3/uL (ref 150–400)
RBC: 4.42 MIL/uL (ref 3.87–5.11)
RDW: 13.3 % (ref 11.5–15.5)
WBC: 5.7 10*3/uL (ref 4.0–10.5)

## 2015-02-14 LAB — ETHANOL: ALCOHOL ETHYL (B): 56 mg/dL — AB (ref <5)

## 2015-02-14 LAB — I-STAT BETA HCG BLOOD, ED (MC, WL, AP ONLY)

## 2015-02-14 MED ORDER — AMMONIA AROMATIC IN INHA
0.3000 mL | Freq: Once | RESPIRATORY_TRACT | Status: AC
  Administered 2015-02-14: 0.3 mL via RESPIRATORY_TRACT
  Filled 2015-02-14: qty 10
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
## 2015-02-14 NOTE — ED Notes (Signed)
Pt verbalized understanding of d/c instructions and has no further questions. Pt stable and NAD.  
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
## 2015-05-23 ENCOUNTER — Emergency Department (INDEPENDENT_AMBULATORY_CARE_PROVIDER_SITE_OTHER)
Admission: EM | Admit: 2015-05-23 | Discharge: 2015-05-23 | Disposition: A | Discharge status: Home / Self Care | Payer: Self-pay | Source: Home / Self Care | Attending: Family Medicine | Admitting: Family Medicine

## 2015-05-23 ENCOUNTER — Encounter (HOSPITAL_COMMUNITY): Payer: Self-pay | Admitting: Emergency Medicine

## 2015-05-23 DIAGNOSIS — R109 Unspecified abdominal pain: Secondary | ICD-10-CM

## 2015-05-23 LAB — POCT URINALYSIS DIP (DEVICE)
BILIRUBIN URINE: NEGATIVE
GLUCOSE, UA: NEGATIVE mg/dL
KETONES UR: NEGATIVE mg/dL
Leukocytes, UA: NEGATIVE
Nitrite: NEGATIVE
PH: 7 (ref 5.0–8.0)
PROTEIN: NEGATIVE mg/dL
SPECIFIC GRAVITY, URINE: 1.025 (ref 1.005–1.030)
UROBILINOGEN UA: 0.2 mg/dL (ref 0.0–1.0)

## 2015-05-23 LAB — POCT PREGNANCY, URINE: Preg Test, Ur: NEGATIVE

## 2015-05-23 NOTE — Discharge Instructions (Signed)
You should go to the ER tonight for evaluation of your pain in your abdomen.   Your pain is beyond the diagnostic ability of the urgent care.

## 2015-05-23 NOTE — ED Notes (Signed)
Pt d/c by Frank Patrick, PA 

## 2015-05-23 NOTE — ED Notes (Signed)
C/o intermittent lower abd pain onset x1 week associated w/seizures  Denies urinary sx, fevers States she has a hx of seizures A&O x4... No acute distress.

## 2015-05-23 NOTE — ED Provider Notes (Signed)
CSN: [id number]     Arrival date & time 05/23/15  1819 History   First MD Initiated Contact with Patient 05/23/15 1941     Chief Complaint  Patient presents with  . Abdominal Pain   (Consider location/radiation/quality/duration/timing/severity/associated sxs/prior Treatment) HPI History obtained from patient:   LOCATION:abdominal pain SEVERITY:8 DURATION:over 1 week and getting worse CONTEXT:sudden onset, no known trauma QUALITY:twisting ache MODIFYING FACTORS:sitting leaned over ASSOCIATED SYMPTOMS:nausea TIMING:now constant OCCUPATION:  Past Medical History  Diagnosis Date  . Anxiety   . Depression   . Seizure-like activity (HCC)   . Sleep apnea     as a child and grew out of it  . Seizures [facility])    Past Surgical History  Procedure Laterality Date  . None    . No past surgeries     Family History  Problem Relation Age of Onset  . Adopted: Yes   Social History  Substance Use Topics  . Smoking status: Never Smoker   . Smokeless tobacco: Never Used  . Alcohol Use: Yes   OB History    No data available     Review of Systems ROS +'ve abdominal pain  Denies: HEADACHE, NAUSEA,   CHEST PAIN, CONGESTION, DYSURIA, SHORTNESS OF BREATH  Allergies  Latex; Wheat bran; Nickel; and Tape  Home Medications   Prior to Admission medications   Not on File   Meds Ordered and Administered this Visit  Medications - No data to display  BP 102/62 mmHg  Pulse 79  Temp(Src) 98.1 F (36.7 C) (Oral)  Resp 12  SpO2 98%  LMP 05/23/2015 No data found.   Physical Exam  Constitutional: She is oriented to person, place, and time. She appears well-developed and well-nourished. She appears distressed.  HENT:  Head: Normocephalic and atraumatic.  Mouth/Throat: Oropharynx is clear and moist.  Pulmonary/Chest: Effort normal and breath sounds normal.  Abdominal: Soft. She exhibits no mass. There is tenderness. There is guarding. There is no rebound.  Musculoskeletal: Normal  range of motion.  Neurological: She is alert and oriented to person, place, and time.  Skin: Skin is warm and dry.    ED Course  Procedures (including critical care time)  Labs Review Labs Reviewed  POCT URINALYSIS DIP (DEVICE) - Abnormal; Notable for the following:    Hgb urine dipstick MODERATE (*)    All other components within normal limits  POCT PREGNANCY, URINE    Imaging Review No results found.   Visual Acuity Review  Right Eye Distance:   Left Eye Distance:   Bilateral Distance:    Right Eye Near:   Left Eye Near:    Bilateral Near:         MDM   1. Abdominal pain, unspecified abdominal location     Pt is advised to go to ER for further evaluation.     Tharon Aquas, PA 05/23/15 2055

## 2015-07-15 ENCOUNTER — Emergency Department (HOSPITAL_COMMUNITY)
Admission: EM | Admit: 2015-07-15 | Discharge: 2015-07-15 | Disposition: A | Discharge status: Home / Self Care | Payer: Self-pay | Source: Home / Self Care

## 2015-07-20 ENCOUNTER — Emergency Department (INDEPENDENT_AMBULATORY_CARE_PROVIDER_SITE_OTHER): Payer: Self-pay

## 2015-07-20 ENCOUNTER — Encounter (HOSPITAL_COMMUNITY): Payer: Self-pay

## 2015-07-20 ENCOUNTER — Other Ambulatory Visit: Payer: Self-pay

## 2015-07-20 ENCOUNTER — Emergency Department (INDEPENDENT_AMBULATORY_CARE_PROVIDER_SITE_OTHER)
Admission: EM | Admit: 2015-07-20 | Discharge: 2015-07-20 | Disposition: A | Discharge status: Home / Self Care | Payer: Self-pay | Source: Home / Self Care | Attending: Emergency Medicine | Admitting: Emergency Medicine

## 2015-07-20 DIAGNOSIS — R0789 Other chest pain: Secondary | ICD-10-CM

## 2015-07-20 MED ORDER — METRONIDAZOLE 0.75 % VA GEL: 1.0000 | Freq: Two times a day (BID) | VAGINAL | Status: DC

## 2015-07-20 MED ORDER — GI COCKTAIL ~~LOC~~
ORAL | Status: AC
  Filled 2015-07-20: qty 30

## 2015-07-20 MED ORDER — GI COCKTAIL ~~LOC~~
30.0000 mL | Freq: Once | ORAL | Status: AC
  Administered 2015-07-20: 30 mL via ORAL

## 2015-07-20 MED ORDER — IBUPROFEN 600 MG PO TABS: 600.0000 mg | ORAL_TABLET | Freq: Four times a day (QID) | ORAL | Status: DC | PRN

## 2015-07-20 NOTE — ED Provider Notes (Signed)
CSN: [id number]     Arrival date & time 07/20/15  1307 History   First MD Initiated Contact with Patient 07/20/15 1324     Chief Complaint  Patient presents with  . Chest Pain   (Consider location/radiation/quality/duration/timing/severity/associated sxs/prior Treatment) HPI  She is a 20 year old woman here for evaluation of chest pain. She states this is been going on for the last 3 days. She states it starts at the base of her throat and radiates down into her chest. It is described as a burning and throbbing pain. It is worse with deep breaths and movement. It is not related to food or exertion. She denies any shortness of breath, but states she does have to take quicker breaths when she is moving since it hurts to take deep breaths.  She denies any sore throat, pain with swallowing, nasal congestion, cough. No recent URI symptoms. No fevers. No injury or trauma. No change in activity or heavy lifting.  She is not on any exogenous estrogen. No recent immobilization or prolonged trips. No known family history of blood clots, but she is adopted.  Past Medical History  Diagnosis Date  . Anxiety   . Depression   . Seizure-like activity (HCC)   . Sleep apnea     as a child and grew out of it  . Seizures [facility])    Past Surgical History  Procedure Laterality Date  . None    . No past surgeries     Family History  Problem Relation Age of Onset  . Adopted: Yes   Social History  Substance Use Topics  . Smoking status: Never Smoker   . Smokeless tobacco: Never Used  . Alcohol Use: Yes   OB History    No data available     Review of Systems As in history of present illness Allergies  Latex; Wheat bran; Nickel; and Tape  Home Medications   Prior to Admission medications   Medication Sig Start Date End Date Taking? Authorizing Provider  ibuprofen (ADVIL,MOTRIN) 600 MG tablet Take 1 tablet (600 mg total) by mouth every 6 (six) hours as needed for moderate pain. 07/20/15   Charm Rings, MD  metroNIDAZOLE (METROGEL VAGINAL) 0.75 % vaginal gel Place 1 Applicatorful vaginally 2 (two) times daily. 07/20/15   Charm Rings, MD   Meds Ordered and Administered this Visit   Medications  gi cocktail (Maalox,Lidocaine,Donnatal) (30 mLs Oral Given 07/20/15 1435)    BP 107/79 mmHg  Pulse 97  Temp(Src) 99.8 F (37.7 C) (Oral)  Resp 18  SpO2 97%  LMP 07/13/2015 No data found.   Physical Exam  Constitutional: She is oriented to person, place, and time. She appears well-developed and well-nourished. No distress.  HENT:  Mouth/Throat: Oropharynx is clear and moist. No oropharyngeal exudate.  Neck: Neck supple.  Cardiovascular: Regular rhythm and normal heart sounds.   No murmur heard. Mild tachycardia  Pulmonary/Chest: Effort normal and breath sounds normal. No respiratory distress. She has no wheezes. She has no rales. She exhibits no tenderness.  She is slightly tender in the epigastric and xiphoid area, but this is separate from the pain she has been experiencing.  Neurological: She is alert and oriented to person, place, and time.    ED Course  Procedures (including critical care time) ED ECG REPORT   Date: 07/20/2015  Rate: 102  Rhythm: sinus tachycardia  QRS Axis: normal  Intervals: normal  ST/T Wave abnormalities: normal  Conduction Disutrbances:none  Narrative Interpretation: sinus  tachycardia, otherwise normal ekg  Old EKG Reviewed: unchanged  I have personally reviewed the EKG tracing and agree with the computerized printout as noted.  Labs Review Labs Reviewed - No data to display  Imaging Review Dg Chest 2 View  07/20/2015  CLINICAL DATA:  Acute sharp chest pain worse with deep breathing. EXAM: CHEST  2 VIEW COMPARISON:  01/23/2015 FINDINGS: The heart size and mediastinal contours are within normal limits. Both lungs are clear. The visualized skeletal structures are unremarkable. Bilateral nipple piercings noted. Minor scoliosis of the spine.  IMPRESSION: No active cardiopulmonary disease. Electronically Signed   By: Judie PetitM.  Shick M.D.   On: 07/20/2015 14:40      MDM   1. Atypical chest pain    X-ray and EKG are unremarkable. GI cocktail did not make a difference in her pain.  I suspect this is musculoskeletal. Will treat with ibuprofen for 5 days. Reviewed strict return precautions with the patient.    Charm RingsErin J Alma Muegge, MD 07/20/15 1459

## 2015-07-20 NOTE — Discharge Instructions (Signed)
I am not sure why you are having pain. Your EKG and chest x-ray are normal. This is likely coming from the muscles and ligaments in your chest wall. Take ibuprofen 3 times a day for the next 5 days. If your symptoms do not improve, or if they get worse or change at any time, please go to the emergency room.

## 2015-07-20 NOTE — ED Notes (Signed)
Patient complains of having chest pain for the past three day Denies any injury No cough or congestion

## 2016-04-06 ENCOUNTER — Encounter (HOSPITAL_COMMUNITY): Payer: Self-pay | Admitting: *Deleted

## 2016-04-06 ENCOUNTER — Emergency Department (HOSPITAL_COMMUNITY)
Admission: EM | Admit: 2016-04-06 | Discharge: 2016-04-06 | Disposition: A | Discharge status: Home / Self Care | Payer: Self-pay | Attending: Emergency Medicine | Admitting: Emergency Medicine

## 2016-04-06 ENCOUNTER — Emergency Department (HOSPITAL_COMMUNITY): Payer: Self-pay

## 2016-04-06 DIAGNOSIS — R1032 Left lower quadrant pain: Secondary | ICD-10-CM

## 2016-04-06 DIAGNOSIS — Z91040 Latex allergy status: Secondary | ICD-10-CM | POA: Insufficient documentation

## 2016-04-06 DIAGNOSIS — N1330 Unspecified hydronephrosis: Secondary | ICD-10-CM | POA: Insufficient documentation

## 2016-04-06 DIAGNOSIS — R300 Dysuria: Secondary | ICD-10-CM | POA: Insufficient documentation

## 2016-04-06 LAB — CBC WITH DIFFERENTIAL/PLATELET
Basophils Absolute: 0 10*3/uL (ref 0.0–0.1)
Basophils Relative: 0 %
Eosinophils Absolute: 0.1 10*3/uL (ref 0.0–0.7)
Eosinophils Relative: 1 %
HCT: 34 % — ABNORMAL LOW (ref 36.0–46.0)
Hemoglobin: 10.9 g/dL — ABNORMAL LOW (ref 12.0–15.0)
Lymphocytes Relative: 14 %
Lymphs Abs: 1.5 10*3/uL (ref 0.7–4.0)
MCH: 25.5 pg — ABNORMAL LOW (ref 26.0–34.0)
MCHC: 32.1 g/dL (ref 30.0–36.0)
MCV: 79.4 fL (ref 78.0–100.0)
Monocytes Absolute: 0.5 10*3/uL (ref 0.1–1.0)
Monocytes Relative: 5 %
Neutro Abs: 8.4 10*3/uL — ABNORMAL HIGH (ref 1.7–7.7)
Neutrophils Relative %: 80 %
Platelets: 225 10*3/uL (ref 150–400)
RBC: 4.28 MIL/uL (ref 3.87–5.11)
RDW: 13.7 % (ref 11.5–15.5)
WBC: 10.6 10*3/uL — ABNORMAL HIGH (ref 4.0–10.5)

## 2016-04-06 LAB — WET PREP, GENITAL
Sperm: NONE SEEN
Trich, Wet Prep: NONE SEEN
Yeast Wet Prep HPF POC: NONE SEEN

## 2016-04-06 LAB — URINALYSIS, ROUTINE W REFLEX MICROSCOPIC
Bilirubin Urine: NEGATIVE
GLUCOSE, UA: NEGATIVE mg/dL
Ketones, ur: NEGATIVE mg/dL
Nitrite: NEGATIVE
Protein, ur: NEGATIVE mg/dL
SPECIFIC GRAVITY, URINE: 1.013 (ref 1.005–1.030)
pH: 8 (ref 5.0–8.0)

## 2016-04-06 LAB — COMPREHENSIVE METABOLIC PANEL
ALT: 10 U/L — ABNORMAL LOW (ref 14–54)
AST: 16 U/L (ref 15–41)
Albumin: 3.5 g/dL (ref 3.5–5.0)
Alkaline Phosphatase: 44 U/L (ref 38–126)
Anion gap: 5 (ref 5–15)
BUN: 11 mg/dL (ref 6–20)
CO2: 25 mmol/L (ref 22–32)
Calcium: 8.8 mg/dL — ABNORMAL LOW (ref 8.9–10.3)
Chloride: 108 mmol/L (ref 101–111)
Creatinine, Ser: 0.64 mg/dL (ref 0.44–1.00)
GFR calc Af Amer: >60 mL/min (ref >60)
GFR calc non Af Amer: >60 mL/min (ref >60)
Glucose, Bld: 108 mg/dL — ABNORMAL HIGH (ref 65–99)
Potassium: 3.5 mmol/L (ref 3.5–5.1)
Sodium: 138 mmol/L (ref 135–145)
Total Bilirubin: 0.6 mg/dL (ref 0.3–1.2)
Total Protein: 6.4 g/dL — ABNORMAL LOW (ref 6.5–8.1)

## 2016-04-06 LAB — CBG MONITORING, ED: Glucose-Capillary: 85 mg/dL (ref 65–99)

## 2016-04-06 LAB — POC URINE PREG, ED: Preg Test, Ur: NEGATIVE

## 2016-04-06 MED ORDER — MORPHINE SULFATE (PF) 4 MG/ML IV SOLN
4.0000 mg | Freq: Once | INTRAVENOUS | Status: AC
  Administered 2016-04-06: 4 mg via INTRAVENOUS
  Filled 2016-04-06: qty 1

## 2016-04-06 MED ORDER — CIPROFLOXACIN HCL 500 MG PO TABS: 500.0000 mg | ORAL_TABLET | Freq: Two times a day (BID) | ORAL | 0 refills | Status: DC

## 2016-04-06 MED ORDER — ONDANSETRON HCL 4 MG PO TABS: 4.0000 mg | ORAL_TABLET | Freq: Four times a day (QID) | ORAL | 0 refills | Status: DC

## 2016-04-06 MED ORDER — SODIUM CHLORIDE 0.9 % IV BOLUS (SEPSIS)
1000.0000 mL | Freq: Once | INTRAVENOUS | Status: AC
  Administered 2016-04-06: 1000 mL via INTRAVENOUS

## 2016-04-06 MED ORDER — IBUPROFEN 800 MG PO TABS: 800.0000 mg | ORAL_TABLET | Freq: Three times a day (TID) | ORAL | 0 refills | Status: DC

## 2016-04-06 MED ORDER — KETOROLAC TROMETHAMINE 30 MG/ML IJ SOLN
30.0000 mg | Freq: Once | INTRAMUSCULAR | Status: AC
  Administered 2016-04-06: 30 mg via INTRAVENOUS
  Filled 2016-04-06: qty 1

## 2016-04-06 MED ORDER — PROMETHAZINE HCL 25 MG/ML IJ SOLN
12.5000 mg | Freq: Once | INTRAMUSCULAR | Status: AC
  Administered 2016-04-06: 12.5 mg via INTRAVENOUS
  Filled 2016-04-06: qty 1

## 2016-04-06 MED ORDER — SODIUM CHLORIDE 0.9 % IV BOLUS (SEPSIS)
500.0000 mL | Freq: Once | INTRAVENOUS | Status: AC
  Administered 2016-04-06: 500 mL via INTRAVENOUS

## 2016-04-06 MED ORDER — ONDANSETRON HCL 4 MG/2ML IJ SOLN
4.0000 mg | Freq: Once | INTRAMUSCULAR | Status: AC
  Administered 2016-04-06: 4 mg via INTRAVENOUS
  Filled 2016-04-06: qty 2

## 2016-04-06 NOTE — ED Notes (Signed)
Pt ambulated in the hallway independently.

## 2016-04-06 NOTE — ED Notes (Signed)
Assisted Pt with bed pan.

## 2016-04-06 NOTE — ED Notes (Signed)
Ambulated Pt to restroom. Pt in pain but tolerated. This tech waited outside restroom. Pt opened door, this tech took her arm to begin ambulation when Pt stated she did not feel good. Pt eyes rolled back and Pt and began to pass out. Pt did not hit floor. This tech moved behind Pt as she passed out and gently laid Pt to floor. This tech called for assistance. Pt regained consciousness , helped to wheelchair and returned to room.

## 2016-04-06 NOTE — ED Notes (Signed)
Pt returned for CT. Pt on monitors

## 2016-04-06 NOTE — ED Provider Notes (Signed)
[facility] DEPT Provider Note   CSN: [id number] Arrival date & time: 04/06/16  0818     History   Chief Complaint Chief Complaint  Patient presents with  . Abdominal Pain    HPI Sharon [name] is a 20 y.o. female with history of seizure who presents with a three-day history of dysuria and 4 hour history of left lower quadrant pain. Patient describes her pain as sharp with radiation to her back. Patient reports her pain is worse with inspiration. Patient has had associated nausea. LMP 03/15/2016. Patient denies any fevers, chest pain, shortness of breath, vomiting, diarrhea, hematuria, pelvic pain, abnormal vaginal discharge, or bleeding. Patient has a known history of bacterial vaginosis and finished a long-term treatment last week. Patient had a syncopal episode that lasted 2 minutes after patient was giving a urine sample here in the ED. Patient was leaving the bathroom and reported she started seeing white and blacked out.  HPI  Past Medical History:  Diagnosis Date  . Anxiety   . Depression   . Seizure-like activity (HCC)   . Seizures (HCC)   . Sleep apnea    as a child and grew out of it    Patient Active Problem List   Diagnosis Date Noted  . Severe recurrent major depressive disorder with psychotic features (HCC)   . PTSD (post-traumatic stress disorder) 06/17/2014  . MDD (major depressive disorder), recurrent episode, severe (HCC) 06/12/2014  . Depression 02/28/2014  . Anxiety 02/28/2014  . Seizures (HCC) 01/10/2014    Past Surgical History:  Procedure Laterality Date  . NO PAST SURGERIES    . None      OB History    No data available       Home Medications    Prior to Admission medications   Medication Sig Start Date End Date Taking? Authorizing Provider  acetaminophen (TYLENOL) 325 MG tablet Take 650 mg by mouth every 6 (six) hours as needed for mild pain.   Yes Historical Provider, MD  ASHLYNA 0.15-0.03 &0.01 MG tablet Take 1 tablet by mouth  daily. 01/06/16  Yes Historical Provider, MD  ciprofloxacin (CIPRO) 500 MG tablet Take 1 tablet (500 mg total) by mouth 2 (two) times daily. 04/06/16   Emi HolesAlexandra M Nakyah Erdmann, PA-C  ibuprofen (ADVIL,MOTRIN) 800 MG tablet Take 1 tablet (800 mg total) by mouth 3 (three) times daily. 04/06/16   Emi HolesAlexandra M Daysia Vandenboom, PA-C  metroNIDAZOLE (METROGEL VAGINAL) 0.75 % vaginal gel Place 1 Applicatorful vaginally 2 (two) times daily. Patient not taking: Reported on 04/06/2016 07/20/15   Charm RingsErin J Honig, MD  ondansetron (ZOFRAN) 4 MG tablet Take 1 tablet (4 mg total) by mouth every 6 (six) hours. 04/06/16   Emi HolesAlexandra M Anuel Sitter, PA-C    Family History Family History  Problem Relation Age of Onset  . Adopted: Yes    Social History Social History  Substance Use Topics  . Smoking status: Never Smoker  . Smokeless tobacco: Never Used  . Alcohol use No     Allergies   Latex; Nickel; and Tape   Review of Systems Review of Systems  Constitutional: Negative for chills and fever.  HENT: Negative for facial swelling and sore throat.   Respiratory: Negative for shortness of breath.   Cardiovascular: Negative for chest pain.  Gastrointestinal: Positive for abdominal pain and nausea. Negative for vomiting.  Genitourinary: Positive for dysuria and flank pain (L).  Musculoskeletal: Negative for back pain.  Skin: Negative for rash and wound.  Neurological: Positive for syncope. Negative  for headaches.  Psychiatric/Behavioral: The patient is not nervous/anxious.      Physical Exam Updated Vital Signs BP 97/58   Pulse 78   Resp 15   Ht 5\' 5"  (1.651 m)   Wt 62.6 kg   LMP 03/15/2016   SpO2 100%   BMI 22.96 kg/m   Physical Exam  Constitutional: She appears well-developed and well-nourished. No distress.  HENT:  Head: Normocephalic and atraumatic.  Mouth/Throat: Oropharynx is clear and moist. No oropharyngeal exudate.  Eyes: Conjunctivae are normal. Pupils are equal, round, and reactive to light. Right eye exhibits  no discharge. Left eye exhibits no discharge. No scleral icterus.  Neck: Normal range of motion. Neck supple. No thyromegaly present.  Cardiovascular: Normal rate, regular rhythm, normal heart sounds and intact distal pulses.  Exam reveals no gallop and no friction rub.   No murmur heard. Pulmonary/Chest: Effort normal and breath sounds normal. No stridor. No respiratory distress. She has no wheezes. She has no rales.  Abdominal: Soft. Bowel sounds are normal. She exhibits no distension. There is tenderness in the left lower quadrant. There is CVA tenderness (L). There is no rebound and no guarding.  Genitourinary: Uterus is not tender. Cervix exhibits no motion tenderness. Right adnexum displays no mass, no tenderness and no fullness. Left adnexum displays no mass, no tenderness and no fullness. No tenderness in the vagina. Vaginal discharge (thick, white/yellow) found.  Musculoskeletal: She exhibits no edema.       Back:  Lymphadenopathy:    She has no cervical adenopathy.  Neurological: She is alert. Coordination normal.  Skin: Skin is warm and dry. No rash noted. She is not diaphoretic. No pallor.  Psychiatric: She has a normal mood and affect.  Nursing note and vitals reviewed.    ED Treatments / Results  Labs (all labs ordered are listed, but only abnormal results are displayed) Labs Reviewed  WET PREP, GENITAL - Abnormal; Notable for the following:       Result Value   Clue Cells Wet Prep HPF POC PRESENT (*)    WBC, Wet Prep HPF POC MODERATE (*)    All other components within normal limits  URINALYSIS, ROUTINE W REFLEX MICROSCOPIC - Abnormal; Notable for the following:    APPearance HAZY (*)    Hgb urine dipstick SMALL (*)    Leukocytes, UA TRACE (*)    Bacteria, UA RARE (*)    Squamous Epithelial / LPF 0-5 (*)    All other components within normal limits  COMPREHENSIVE METABOLIC PANEL - Abnormal; Notable for the following:    Glucose, Bld 108 (*)    Calcium 8.8 (*)     Total Protein 6.4 (*)    ALT 10 (*)    All other components within normal limits  CBC WITH DIFFERENTIAL/PLATELET - Abnormal; Notable for the following:    WBC 10.6 (*)    Hemoglobin 10.9 (*)    HCT 34.0 (*)    MCH 25.5 (*)    Neutro Abs 8.4 (*)    All other components within normal limits  URINE CULTURE  POC URINE PREG, ED  CBG MONITORING, ED  [facility]DIA [facility]    EKG  EKG Interpretation None       Radiology Ct Renal Stone Study  Result Date: 04/06/2016 CLINICAL DATA:  Left lower quadrant and left flank pain for 1 day EXAM: CT ABDOMEN AND PELVIS WITHOUT CONTRAST TECHNIQUE: Multidetector CT imaging of the abdomen and pelvis was performed  following the standard protocol without oral or intravenous contrast material administration. COMPARISON:  None. FINDINGS: Lower chest: Lung bases are clear. Hepatobiliary: Liver measures 18.1 cm in length. No focal liver lesions are evident on this noncontrast enhanced study. Gallbladder wall is not appreciably thickened. There is no biliary duct dilatation. Pancreas: There is no pancreatic mass or inflammatory focus. Spleen: No splenic lesions are evident. Adrenals/Urinary Tract: Adrenals appear normal bilaterally. There is no renal mass on either side. There is slight hydronephrosis on the left. There is no hydronephrosis on the right. There is no intrarenal calculus on this side. There are several 1-2 mm calcifications in the pelvis which are felt to be near but separate from the distal ureters, representing phleboliths. No convincing ureteral calculus is evident. There is wall thickening in the urinary bladder. Stomach/Bowel: There is no bowel wall or mesenteric thickening. There is no appreciable bowel obstruction. No free air or portal venous air. Vascular/Lymphatic: There is no abdominal aortic aneurysm. No vascular lesions are evident on this noncontrast enhanced study. No adenopathy is evident in the abdomen or  pelvis. Reproductive: Uterus is anteverted. There is no pelvic mass or pelvic fluid collection. Other: Appendix is unremarkable. There is no ascites or abscess in the abdomen or pelvis. Musculoskeletal: There are no blastic or lytic bone lesions. There is no intramuscular or abdominal wall lesion. IMPRESSION: Mild left-sided hydronephrosis. No apparent renal or ureteral calculus. Several small pelvic calcifications are felt to reside near but separate from the left ureter. No renal or ureteral calculus is evident on either side. The hydronephrosis on the left could be indicative of recent calculus passage by the on right wrist. No abscess is seen in the kidneys, and there is no perinephric stranding on either side. There is urinary bladder wall thickening, felt to represent a degree of cystitis. No bowel obstruction. No abscess. No periappendiceal region inflammation. Liver mildly prominent without focal lesion on this noncontrast enhanced study. Electronically Signed   By: Bretta Bang III M.D.   On: 04/06/2016 11:10    Procedures Procedures (including critical care time)  Medications Ordered in ED Medications  sodium chloride 0.9 % bolus 1,000 mL (0 mLs Intravenous Stopped 04/06/16 1226)  morphine 4 MG/ML injection 4 mg (4 mg Intravenous Given 04/06/16 0927)  ondansetron (ZOFRAN) injection 4 mg (4 mg Intravenous Given 04/06/16 0927)  ketorolac (TORADOL) 30 MG/ML injection 30 mg (30 mg Intravenous Given 04/06/16 1106)  promethazine (PHENERGAN) injection 12.5 mg (12.5 mg Intravenous Given 04/06/16 1106)  sodium chloride 0.9 % bolus 500 mL (0 mLs Intravenous Stopped 04/06/16 1342)     Initial Impression / Assessment and Plan / ED Course  I have reviewed the triage vital signs and the nursing notes.  Pertinent labs & imaging results that were available during my care of the patient were reviewed by me and considered in my medical decision making (see chart for details).  Clinical Course      Patient with recent passed stone versus pyelonephritis. CBC shows WBC 10.6, hemoglobin 10.9. CMP shows calcium 8.8, protein 6.4, ALT 10. Urine pregnancy negative. UA shows small hematuria, trace leukocytes, too numerous to count WBCs, rare bacteria. Urine culture sent. Wet prep shows clue cells and moderate WBCs. Patient currently being treated for bacterial vaginosis. GC/chlamydia sent and pending. Patient declined HIV, RPR. Patient made aware that she would be called for any positive results. She understands that she should seek treatment at her OB/GYN or health Department if positive. EKG shows NSR. CT  renal stone study shows [Mild left-sided hydronephrosis. No apparent renal or ureteral calculus. Several small pelvic calcifications are felt to reside near but separate from the left ureter. No renal or ureteral calculus is evident on either side. The hydronephrosis on the left could be indicative of recent calculus passage by the on right wrist. No abscess is seen in the kidneys, and there is no perinephric stranding on either side. There is urinary bladder wall thickening, felt to represent a degree of cystitis. No bowel obstruction. No abscess. No periappendiceal region inflammation. Liver mildly prominent without focal lesion on this noncontrast enhanced study.] She had a syncopal episode in the ED, patient has not eaten today. Patient given fluid and food with improvement of lightheadedness. Patient ambulatory multiple times after this without any more syncopal or near syncopal episodes. Patient's pain controlled in the ED with Toradol. We'll treat patient's UTI with Cipro for possible pyelonephritis. Discharge home with symptomatic treatment including Zofran and ibuprofen. Follow-up to urology in 1 week. Strict return precautions given. Patient understands and agrees with plan. I discussed patient case with Dr. Clayborne Dana who guided the patient's management and agrees with plan.  Final Clinical  Impressions(s) / ED Diagnoses   Final diagnoses:  Hydronephrosis of left kidney  LLQ pain  Dysuria    New Prescriptions Discharge Medication List as of 04/06/2016  3:03 PM    START taking these medications   Details  ciprofloxacin (CIPRO) 500 MG tablet Take 1 tablet (500 mg total) by mouth 2 (two) times daily., Starting Tue 04/06/2016, Print    ondansetron (ZOFRAN) 4 MG tablet Take 1 tablet (4 mg total) by mouth every 6 (six) hours., Starting Tue 04/06/2016, Print         Emi Holes, PA-C 04/06/16 1612    Emi Holes, PA-C 04/06/16 1613    Marily Memos, MD 04/07/16 [phone]

## 2016-04-06 NOTE — ED Notes (Signed)
PA at bedside, per EMT pt was in restroom & needed to be lowered from the standing position d/t feeling faint, will continue to monitor

## 2016-04-06 NOTE — ED Notes (Signed)
Patient transported to CT 

## 2016-04-06 NOTE — ED Triage Notes (Signed)
Pt in from home c/o L abd pain that radiate to the lower L back, pt c/o dysuria, pt denies hematuria, c/o nausea, denies v/d,  A&O x4

## 2016-04-06 NOTE — Discharge Instructions (Signed)
Medications: Cipro, Zofran, ibuprofen  Treatment: Take Cipro twice daily for 1 week. Make sure to finish all of this medication. Take Zofran every 6 hours as needed for nausea and vomiting. Take ibuprofen every 8 hours as needed for your pain.  Follow-up: Please follow-up with urology within 1 week for follow-up and further evaluation and treatment of your symptoms. It is possible you passed a kidney stone today. We will also treat you for your new tract infection with possible spread to kidneys. You'll be called in 2-3 days if you need change in antibiotics.

## 2016-04-07 LAB — GC/CHLAMYDIA PROBE AMP (~~LOC~~) NOT AT ARMC
Chlamydia: NEGATIVE
Neisseria Gonorrhea: NEGATIVE

## 2016-04-08 LAB — URINE CULTURE
Culture: >=100000 — AB
Special Requests: NORMAL

## 2016-04-09 ENCOUNTER — Telehealth: Payer: Self-pay

## 2016-04-09 NOTE — Telephone Encounter (Signed)
Post ED Visit - Positive Culture Follow-up  Culture report reviewed by antimicrobial stewardship pharmacist:  []  Enzo BiNathan Batchelder, Pharm.D. []  Celedonio MiyamotoJeremy Frens, Pharm.D., BCPS [x]  Garvin FilaMike Maccia, Pharm.D. []  Georgina PillionElizabeth Martin, [address] BCPS []  Red LodgeMinh Pham, [address]Ph[address]D., BCPS, AAHIVP []  Estella HuskMichelle Turner, Pharm.D., BCPS, AAHIVP []  Tennis Mustassie Stewart, Pharm.D. []  Sherle Poeob Vincent, [address]  Positive urine culture Treated with Ciprofloxacin, organism sensitive to the same and no further patient follow-up is required at this time.  Jerry CarasCullom, Alyah Boehning Burnett 04/09/2016, 9:12 AM

## 2016-07-11 ENCOUNTER — Emergency Department (HOSPITAL_COMMUNITY)
Admission: EM | Admit: 2016-07-11 | Discharge: 2016-07-11 | Disposition: A | Discharge status: Home / Self Care | Payer: Self-pay | Attending: Emergency Medicine | Admitting: Emergency Medicine

## 2016-07-11 ENCOUNTER — Encounter (HOSPITAL_COMMUNITY): Payer: Self-pay

## 2016-07-11 DIAGNOSIS — R0982 Postnasal drip: Secondary | ICD-10-CM | POA: Insufficient documentation

## 2016-07-11 DIAGNOSIS — Z79899 Other long term (current) drug therapy: Secondary | ICD-10-CM | POA: Insufficient documentation

## 2016-07-11 DIAGNOSIS — N3000 Acute cystitis without hematuria: Secondary | ICD-10-CM | POA: Insufficient documentation

## 2016-07-11 LAB — URINALYSIS, ROUTINE W REFLEX MICROSCOPIC
Bilirubin Urine: NEGATIVE
Glucose, UA: NEGATIVE mg/dL
Hgb urine dipstick: NEGATIVE
Ketones, ur: NEGATIVE mg/dL
Nitrite: NEGATIVE
PH: 6 (ref 5.0–8.0)
Protein, ur: 100 mg/dL — AB
SPECIFIC GRAVITY, URINE: 1.028 (ref 1.005–1.030)

## 2016-07-11 LAB — POC URINE PREG, ED: Preg Test, Ur: NEGATIVE

## 2016-07-11 MED ORDER — FLUTICASONE PROPIONATE 50 MCG/ACT NA SUSP: 2.0000 | Freq: Every day | NASAL | 0 refills | Status: AC

## 2016-07-11 MED ORDER — NITROFURANTOIN MONOHYD MACRO 100 MG PO CAPS
100.0000 mg | ORAL_CAPSULE | Freq: Once | ORAL | Status: AC
  Administered 2016-07-11: 100 mg via ORAL
  Filled 2016-07-11: qty 1

## 2016-07-11 MED ORDER — NITROFURANTOIN MONOHYD MACRO 100 MG PO CAPS: 100.0000 mg | ORAL_CAPSULE | Freq: Two times a day (BID) | ORAL | 0 refills | Status: DC

## 2016-07-11 NOTE — ED Notes (Signed)
Pt understood dc material. NAD noted. SCripts given at dc 

## 2016-07-11 NOTE — ED Triage Notes (Signed)
Pt complaining of cough x 3 weeks. Pt states coughing up yellow/green sputum. Pt states believes has UTI. Pt states some odor to urine, also complaining of some dysuria.

## 2016-07-11 NOTE — ED Provider Notes (Signed)
[facility] Provider Note   CSN: [id number] Arrival date & time: 07/11/16  0150  By signing my name below, I, Sharon [name], attest that this documentation has been prepared under the direction and in the presence of Sharon Pittman, MD. Electronically Signed: Rosario AdieWilliam Andrew [name], ED Scribe. 07/11/16. 2:41 AM.  History   Chief Complaint Chief Complaint  Patient presents with  . Cough  . Dysuria   The history is provided by the patient. No language interpreter was used.  Cough  This is a new problem. The current episode started more than 1 week ago. The problem occurs every few minutes. The problem has been gradually worsening. The cough is productive of sputum. There has been no fever. Pertinent negatives include no chest pain, no chills and no shortness of breath. She has tried nothing for the symptoms. The treatment provided no relief. Risk factors: none. Her past medical history does not include pneumonia.    HPI Comments: Sharon [name] is a 21 y.o. female who presents to the Emergency Department complaining of persistent productive cough w/ yellow-green sputum beginning three weeks ago. She notes associated congestion and post-nasal drip secondary to her cough. No noted treatments for her symptoms were tried prior to coming into the ED. No modifying factors noted. She denies chest pain, shortness of breath, fever, or any other associated symptoms.   Secondarily she is c/o malodorous urine and mild dysuria beginning earlier today. She denies frequency, urgency, difficulty urinating, hematuria.   Past Medical History:  Diagnosis Date  . Anxiety   . Depression   . Seizure-like activity (HCC)   . Seizures (HCC)   . Sleep apnea    as a child and grew out of it   Patient Active Problem List   Diagnosis Date Noted  . Severe recurrent major depressive disorder with psychotic features (HCC)   . PTSD (post-traumatic stress disorder) 06/17/2014  . MDD (major depressive  disorder), recurrent episode, severe (HCC) 06/12/2014  . Depression 02/28/2014  . Anxiety 02/28/2014  . Seizures (HCC) 01/10/2014   Past Surgical History:  Procedure Laterality Date  . NO PAST SURGERIES    . None     OB History    No data available     Home Medications    Prior to Admission medications   Medication Sig Start Date End Date Taking? Authorizing Provider  ASHLYNA 0.15-0.03 &0.01 MG tablet Take 1 tablet by mouth daily. 01/06/16  Yes Historical Provider, MD  ciprofloxacin (CIPRO) 500 MG tablet Take 1 tablet (500 mg total) by mouth 2 (two) times daily. Patient not taking: Reported on 07/11/2016 04/06/16   Waylan BogaAlexandra M Law, PA-C  fluticasone [facility](FLONASE) 50 MCG/ACT nasal spray Place 2 sprays into both nostrils daily. 07/11/16   Shareen Capwell, MD  ibuprofen (ADVIL,MOTRIN) 800 MG tablet Take 1 tablet (800 mg total) by mouth 3 (three) times daily. Patient not taking: Reported on 07/11/2016 04/06/16   Waylan BogaAlexandra M Law, PA-C  metroNIDAZOLE (METROGEL VAGINAL) 0.75 % vaginal gel Place 1 Applicatorful vaginally 2 (two) times daily. Patient not taking: Reported on 04/06/2016 07/20/15   Charm RingsErin J Honig, MD  nitrofurantoin, macrocrystal-monohydrate, (MACROBID) 100 MG capsule Take 1 capsule (100 mg total) by mouth 2 (two) times daily. X 7 days 07/11/16   Jamelle Noy, MD  ondansetron (ZOFRAN) 4 MG tablet Take 1 tablet (4 mg total) by mouth every 6 (six) hours. Patient not taking: Reported on 07/11/2016 04/06/16   Emi HolesAlexandra M Law, PA-C   Family History Family History  Problem Relation Age of Onset  . Adopted: Yes   Social History Social History  Substance Use Topics  . Smoking status: Never Smoker  . Smokeless tobacco: Never Used  . Alcohol use No   Allergies   Latex; Nickel; and Tape  Review of Systems Review of Systems  Constitutional: Negative for appetite change, chills and fever.  HENT: Positive for congestion and postnasal drip. Negative for drooling and facial swelling.   Eyes:  Negative for photophobia.  Respiratory: Positive for cough. Negative for shortness of breath.   Cardiovascular: Negative for chest pain, palpitations and leg swelling.  Gastrointestinal: Negative for anal bleeding.  Genitourinary: Positive for dysuria. Negative for difficulty urinating, frequency, hematuria and urgency.  Neurological: Negative for facial asymmetry and speech difficulty.  Psychiatric/Behavioral: Negative for suicidal ideas.  All other systems reviewed and are negative.  Physical Exam Updated Vital Signs BP 105/84   Pulse 68   Temp 98.1 F (36.7 C) (Oral)   Resp 17   Ht 5\' 5"  (1.651 m)   Wt 132 lb 1 oz (59.9 kg)   LMP 07/09/2016 (Exact Date)   SpO2 99%   BMI 21.98 kg/m   Physical Exam  Constitutional: She appears well-developed and well-nourished. No distress.  HENT:  Head: Normocephalic.  Mouth/Throat: Oropharynx is clear and moist. No oropharyngeal exudate.  Clear, colorless PND.   Eyes: Conjunctivae and EOM are normal. Pupils are equal, round, and reactive to light. Right eye exhibits no discharge. Left eye exhibits no discharge. No scleral icterus.  Neck: Normal range of motion. Neck supple. No JVD present. No tracheal deviation present.  Trachea is midline. No stridor or carotid bruits.   Cardiovascular: Normal rate, regular rhythm, normal heart sounds and intact distal pulses.  Exam reveals no gallop and no friction rub.   No murmur heard. Pulmonary/Chest: Effort normal and breath sounds normal. No stridor. No respiratory distress. She has no wheezes. She has no rales.  Lungs CTA bilaterally.  Abdominal: Soft. Bowel sounds are normal. She exhibits no distension. There is no tenderness. There is no rebound and no guarding.  Musculoskeletal: Normal range of motion. She exhibits no edema or tenderness.  All compartments are soft. No palpable cords.   Lymphadenopathy:    She has no cervical adenopathy.  Neurological: She is alert. She has normal reflexes. She  displays normal reflexes. She exhibits normal muscle tone.  Skin: Skin is warm and dry. Capillary refill takes less than 2 seconds.  Psychiatric: She has a normal mood and affect. Her behavior is normal.  Nursing note and vitals reviewed.  ED Treatments / Results   Vitals:   07/11/16 0230 07/11/16 0245  BP: 118/85 105/84  Pulse: 74 68  Resp:    Temp:      DIAGNOSTIC STUDIES: Oxygen Saturation is 100% on RA, norma by my interpretation.   COORDINATION OF CARE: 2:41 AM-Discussed next steps with pt. Pt verbalized understanding and is agreeable with the plan.   Results for orders placed or performed during the hospital encounter of 07/11/16  Urinalysis, Routine w reflex microscopic  Result Value Ref Range   Color, Urine YELLOW YELLOW   APPearance HAZY (A) CLEAR   Specific Gravity, Urine 1.028 1.005 - 1.030   pH 6.0 5.0 - 8.0   Glucose, UA NEGATIVE NEGATIVE mg/dL   Hgb urine dipstick NEGATIVE NEGATIVE   Bilirubin Urine NEGATIVE NEGATIVE   Ketones, ur NEGATIVE NEGATIVE mg/dL   Protein, ur 409 (A) NEGATIVE mg/dL   Nitrite NEGATIVE NEGATIVE  Leukocytes, UA TRACE (A) NEGATIVE   RBC / HPF 0-5 0 - 5 RBC/hpf   WBC, UA TOO NUMEROUS TO COUNT 0 - 5 WBC/hpf   Bacteria, UA RARE (A) NONE SEEN   Squamous Epithelial / LPF 6-30 (A) NONE SEEN   Mucous PRESENT    Hyaline Casts, UA PRESENT   POC Urine Pregnancy, ED (do NOT order at [facility])  Result Value Ref Range   Preg Test, Ur NEGATIVE NEGATIVE   No results found.   Procedures   Medications Ordered in ED Medications  nitrofurantoin (macrocrystal-monohydrate) (MACROBID) capsule 100 mg (not administered)   Final Clinical Impressions(s) / ED Diagnoses   Final diagnoses:  Acute cystitis without hematuria  Post-nasal drip   This is a 21 y.o. -year-old female presents with three weeks of cough/congestion. One day of dysuria and malodorous urine. The patient is nontoxic-appearing on exam and vital signs are within normal limits.     Lungs CTA bilaterally. Wells 0, PERC neg. Highly doubt PE in this very low risk pt. Will give Macrobid for UTI. Advised f/u w/ PCP is symptoms do not improve or into the ED is worse. Return for CP, SOB, leg pain, leg swelling, hemoptysis, fever greater that 101.   After history, exam, and medical workup I feel the patient has been appropriately medically screened and is safe for discharge home. Pertinent diagnoses were discussed with the patient. Patient was given return precautions.  I personally performed the services described in this documentation, which was scribed in my presence. The recorded information has been reviewed and is accurate.       Cy Blamer, MD 07/11/16 [phone]

## 2016-07-16 ENCOUNTER — Emergency Department (HOSPITAL_COMMUNITY): Payer: Self-pay

## 2016-07-16 ENCOUNTER — Encounter (HOSPITAL_COMMUNITY): Payer: Self-pay

## 2016-07-16 ENCOUNTER — Telehealth: Payer: Self-pay | Admitting: *Deleted

## 2016-07-16 ENCOUNTER — Emergency Department (HOSPITAL_COMMUNITY)
Admission: EM | Admit: 2016-07-16 | Discharge: 2016-07-17 | Disposition: A | Discharge status: Home / Self Care | Payer: Self-pay | Attending: Emergency Medicine | Admitting: Emergency Medicine

## 2016-07-16 DIAGNOSIS — Z79899 Other long term (current) drug therapy: Secondary | ICD-10-CM | POA: Insufficient documentation

## 2016-07-16 DIAGNOSIS — R1031 Right lower quadrant pain: Secondary | ICD-10-CM | POA: Insufficient documentation

## 2016-07-16 DIAGNOSIS — G40909 Epilepsy, unspecified, not intractable, without status epilepticus: Secondary | ICD-10-CM | POA: Insufficient documentation

## 2016-07-16 DIAGNOSIS — R569 Unspecified convulsions: Secondary | ICD-10-CM

## 2016-07-16 DIAGNOSIS — Z91040 Latex allergy status: Secondary | ICD-10-CM | POA: Insufficient documentation

## 2016-07-16 LAB — COMPREHENSIVE METABOLIC PANEL
ALT: 13 U/L — AB (ref 14–54)
AST: 19 U/L (ref 15–41)
Albumin: 4.1 g/dL (ref 3.5–5.0)
Alkaline Phosphatase: 54 U/L (ref 38–126)
Anion gap: 5 (ref 5–15)
BUN: 10 mg/dL (ref 6–20)
CHLORIDE: 106 mmol/L (ref 101–111)
CO2: 27 mmol/L (ref 22–32)
CREATININE: 0.77 mg/dL (ref 0.44–1.00)
Calcium: 9.4 mg/dL (ref 8.9–10.3)
GFR calc Af Amer: >60 mL/min (ref >60)
Glucose, Bld: 83 mg/dL (ref 65–99)
Potassium: 3.5 mmol/L (ref 3.5–5.1)
SODIUM: 138 mmol/L (ref 135–145)
Total Bilirubin: 0.6 mg/dL (ref 0.3–1.2)
Total Protein: 7.3 g/dL (ref 6.5–8.1)

## 2016-07-16 LAB — CBC WITH DIFFERENTIAL/PLATELET
Basophils Absolute: 0 10*3/uL (ref 0.0–0.1)
Basophils Relative: 0 %
EOS ABS: 0 10*3/uL (ref 0.0–0.7)
Eosinophils Relative: 0 %
HCT: 39.2 % (ref 36.0–46.0)
Hemoglobin: 12.4 g/dL (ref 12.0–15.0)
LYMPHS ABS: 1.6 10*3/uL (ref 0.7–4.0)
Lymphocytes Relative: 21 %
MCH: 25.5 pg — AB (ref 26.0–34.0)
MCHC: 31.6 g/dL (ref 30.0–36.0)
MCV: 80.7 fL (ref 78.0–100.0)
MONO ABS: 1 10*3/uL (ref 0.1–1.0)
MONOS PCT: 13 %
Neutro Abs: 4.9 10*3/uL (ref 1.7–7.7)
Neutrophils Relative %: 66 %
PLATELETS: 218 10*3/uL (ref 150–400)
RBC: 4.86 MIL/uL (ref 3.87–5.11)
RDW: 13.9 % (ref 11.5–15.5)
WBC: 7.5 10*3/uL (ref 4.0–10.5)

## 2016-07-16 LAB — I-STAT BETA HCG BLOOD, ED (MC, WL, AP ONLY): I-stat hCG, quantitative: <5 m[IU]/mL (ref <5)

## 2016-07-16 LAB — WET PREP, GENITAL
Sperm: NONE SEEN
Trich, Wet Prep: NONE SEEN
Yeast Wet Prep HPF POC: NONE SEEN

## 2016-07-16 LAB — RAPID URINE DRUG SCREEN, HOSP PERFORMED
Amphetamines: NOT DETECTED
BARBITURATES: NOT DETECTED
BENZODIAZEPINES: NOT DETECTED
COCAINE: NOT DETECTED
Opiates: NOT DETECTED
Tetrahydrocannabinol: POSITIVE — AB

## 2016-07-16 LAB — POC URINE PREG, ED: PREG TEST UR: NEGATIVE

## 2016-07-16 MED ORDER — KETOROLAC TROMETHAMINE 15 MG/ML IJ SOLN
15.0000 mg | Freq: Once | INTRAMUSCULAR | Status: AC
  Administered 2016-07-16: 15 mg via INTRAVENOUS
  Filled 2016-07-16: qty 1

## 2016-07-16 NOTE — ED Notes (Signed)
Ultrasound in process at bedside at this time. 

## 2016-07-16 NOTE — Telephone Encounter (Signed)
Pt called because she misplaced Rx for nitrofurantoin, macrocrystal-monohydrate, (MACROBID) 100 MG capsule, asked EDCM to reprint for her.  [facility] informed pt that I am unable to reprint Rx but will be happy to call in to pharmacy.  Pt appreciative.

## 2016-07-16 NOTE — ED Notes (Signed)
Pt. Just ambulated to the bathroom. Upon coming back from bathroom states she is in worse pain. States pain has gone up to an 8 1/2 on a scale of 10.

## 2016-07-16 NOTE — ED Triage Notes (Signed)
Pt BIB [facility] from work. Manager noticed that pt was acting differently and assisted her to the floor. Pt had a seizure for 1 min with a 3-5 min post ictal period. Has had a total of 4 seizures. Full body convulsions noted during seizure. No oral trauma. No incontinence. Pt also feels like she is passing a kidney stone. Hx of same. Also positive for a UTI. Not taking anything currently for seizures. A&Ox4 on arrival.

## 2016-07-17 LAB — URINALYSIS, ROUTINE W REFLEX MICROSCOPIC
BILIRUBIN URINE: NEGATIVE
GLUCOSE, UA: NEGATIVE mg/dL
Ketones, ur: NEGATIVE mg/dL
Nitrite: POSITIVE — AB
PH: 6.5 (ref 5.0–8.0)
Protein, ur: 30 mg/dL — AB
SPECIFIC GRAVITY, URINE: 1.015 (ref 1.005–1.030)

## 2016-07-17 LAB — URINALYSIS, MICROSCOPIC (REFLEX)

## 2016-07-17 MED ORDER — LEVETIRACETAM 750 MG PO TABS: 750.0000 mg | ORAL_TABLET | Freq: Two times a day (BID) | ORAL | 0 refills | Status: DC

## 2016-07-17 MED ORDER — LEVETIRACETAM 500 MG PO TABS
1000.0000 mg | ORAL_TABLET | Freq: Once | ORAL | Status: AC
  Administered 2016-07-17: 1000 mg via ORAL
  Filled 2016-07-17: qty 2

## 2016-07-17 MED ORDER — CEPHALEXIN 500 MG PO CAPS
500.0000 mg | ORAL_CAPSULE | Freq: Once | ORAL | Status: AC
  Administered 2016-07-17: 500 mg via ORAL
  Filled 2016-07-17: qty 1

## 2016-07-17 NOTE — Discharge Instructions (Signed)
See one of the Neurology groups as soon as possible. Take the meds prescribed.  [place] law prevents people with seizures or fainting from driving or operating dangerous machinery until they are free of seizures or fainting for 6 months.

## 2016-07-18 NOTE — ED Provider Notes (Signed)
I just spoke with Ms. Point. She hasn't picked up her macrobid or keppra. I have advised her to pick them up immediately - but she reported to me that she will be out of town until tomorrow. Pt aware that she can have repeat seizure or kidney infection. I wished her well, and encouraged to fill her rx as soon as possible.   Derwood Kaplan, MD 07/18/16 [phone]

## 2016-07-18 NOTE — ED Provider Notes (Signed)
[facility]-EMERGENCY DEPT Provider Note   CSN: [id number] Arrival date & time: 07/16/16  1749     History   Chief Complaint Chief Complaint  Patient presents with  . Seizures    HPI Sharon [name] is a 21 y.o. female.  HPI  Pt comes in after having a seizure. Pt has hx of seizures disorder, she is not taking her keppra. PT reports that she had a seizure while at work. Pt denies any recent infection, stress, drug use, and doesn't think she is pregnant. PT is also having severe RLQ abd pain. The pain started prior to the seizure. Patient has no pain with urination, blood in the urine, or frequent urination - however, she does report that she was recently diagnosed with UTI and has not picked up her prescription yet. Pt also denies any vaginal discharge or bleeding. Pt has hx of kidney stone, and the pain is similar.  Seizures meds have not been taken as patient has no insurance.  Per report from the EMS - 4 seizures total.  Past Medical History:  Diagnosis Date  . Anxiety   . Depression   . Seizure-like activity (HCC)   . Seizures (HCC)   . Sleep apnea    as a child and grew out of it    Patient Active Problem List   Diagnosis Date Noted  . Severe recurrent major depressive disorder with psychotic features (HCC)   . PTSD (post-traumatic stress disorder) 06/17/2014  . MDD (major depressive disorder), recurrent episode, severe (HCC) 06/12/2014  . Depression 02/28/2014  . Anxiety 02/28/2014  . Seizures (HCC) 01/10/2014    Past Surgical History:  Procedure Laterality Date  . NO PAST SURGERIES    . None      OB History    No data available       Home Medications    Prior to Admission medications   Medication Sig Start Date End Date Taking? Authorizing Provider  ASHLYNA 0.15-0.03 &0.01 MG tablet Take 1 tablet by mouth daily. 01/06/16  Yes Historical Provider, MD  ciprofloxacin (CIPRO) 500 MG tablet Take 1 tablet (500 mg total) by mouth 2 (two) times daily. Patient  not taking: Reported on 07/11/2016 04/06/16   Waylan Boga Law, PA-C  fluticasone [facility]) 50 MCG/ACT nasal spray Place 2 sprays into both nostrils daily. 07/11/16   April Palumbo, MD  ibuprofen (ADVIL,MOTRIN) 800 MG tablet Take 1 tablet (800 mg total) by mouth 3 (three) times daily. Patient not taking: Reported on 07/11/2016 04/06/16   Emi Holes, PA-C  levETIRAcetam (KEPPRA) 750 MG tablet Take 1 tablet (750 mg total) by mouth 2 (two) times daily. 07/17/16   Derwood Kaplan, MD  nitrofurantoin, macrocrystal-monohydrate, (MACROBID) 100 MG capsule Take 1 capsule (100 mg total) by mouth 2 (two) times daily. X 7 days 07/11/16   April Palumbo, MD  ondansetron (ZOFRAN) 4 MG tablet Take 1 tablet (4 mg total) by mouth every 6 (six) hours. Patient not taking: Reported on 07/11/2016 04/06/16   Emi Holes, PA-C    Family History Family History  Problem Relation Age of Onset  . Adopted: Yes    Social History Social History  Substance Use Topics  . Smoking status: Never Smoker  . Smokeless tobacco: Never Used  . Alcohol use No     Allergies   Latex; Nickel; and Tape   Review of Systems Review of Systems  ROS 10 Systems reviewed and are negative for acute change except as noted in the HPI.  Physical Exam Updated Vital Signs BP 112/74   Pulse 98   Temp 98 F (36.7 C) (Oral)   Resp 17   LMP 07/09/2016 (Exact Date) Comment: HCG neg 07/16/16  SpO2 100%   Physical Exam  Constitutional: She is oriented to person, place, and time. She appears well-developed and well-nourished.  HENT:  Head: Normocephalic and atraumatic.  Eyes: Conjunctivae and EOM are normal. Pupils are equal, round, and reactive to light.  Neck: Normal range of motion. Neck supple.  Cardiovascular: Normal rate, regular rhythm, normal heart sounds and intact distal pulses.   No murmur heard. Pulmonary/Chest: Effort normal. No respiratory distress. She has no wheezes.  Abdominal: Soft. Bowel sounds are  normal. She exhibits no distension. There is tenderness. There is no rebound and no guarding.  Lower abd tenderness, diffusely, no rebound or guarding.  Genitourinary: Vagina normal and uterus normal.  Genitourinary Comments: External exam - normal, no lesions Speculum exam: Pt has some white discharge, no blood Bimanual exam: Patient has no CMT, no adnexal tenderness or fullness and cervical os is closed  Neurological: She is alert and oriented to person, place, and time. No cranial nerve deficit. Coordination normal.  Skin: Skin is warm and dry.     ED Treatments / Results  Labs (all labs ordered are listed, but only abnormal results are displayed) Labs Reviewed  WET PREP, GENITAL - Abnormal; Notable for the following:       Result Value   Clue Cells Wet Prep HPF POC PRESENT (*)    WBC, Wet Prep HPF POC FEW (*)    All other components within normal limits  RAPID URINE DRUG SCREEN, HOSP PERFORMED - Abnormal; Notable for the following:    Tetrahydrocannabinol POSITIVE (*)    All other components within normal limits  COMPREHENSIVE METABOLIC PANEL - Abnormal; Notable for the following:    ALT 13 (*)    All other components within normal limits  CBC WITH DIFFERENTIAL/PLATELET - Abnormal; Notable for the following:    MCH 25.5 (*)    All other components within normal limits  URINALYSIS, ROUTINE W REFLEX MICROSCOPIC - Abnormal; Notable for the following:    APPearance CLOUDY (*)    Hgb urine dipstick MODERATE (*)    Protein, ur 30 (*)    Nitrite POSITIVE (*)    Leukocytes, UA TRACE (*)    All other components within normal limits  URINALYSIS, MICROSCOPIC (REFLEX) - Abnormal; Notable for the following:    Bacteria, UA MANY (*)    Squamous Epithelial / LPF 6-30 (*)    All other components within normal limits  I-STAT BETA HCG BLOOD, ED ([facility], [facility], [facility] ONLY)  POC URINE PREG, ED  GC/CHLAMYDIA PROBE AMP ([place]) NOT AT [facility]    EKG  EKG Interpretation None        Radiology US Transvaginal Non-ob  Result Date: 07/16/2016 CLINICAL DATA:  Right lower quadrant pain.  Positive for UTI. EXAM: TRANSABDOMINAL AND TRANSVAGINAL ULTRASOUND OF PELVIS DOPPLER ULTRASOUND OF OVARIES TECHNIQUE: Both transabdominal and transvaginal ultrasound examinations of the pelvis were performed. Transabdominal technique was performed for global imaging of the pelvis including uterus, ovaries, adnexal regions, and pelvic cul-de-sac. It was necessary to proceed with endovaginal exam following the transabdominal exam to visualize the ovaries. Color and duplex Doppler ultrasound was utilized to evaluate blood flow to the ovaries. COMPARISON:  None. FINDINGS: Uterus Measurements: 8.3 x 3.7 x 4.5. No fibroids or other mass visualized. The uterus is anteverted and retroflexed  on some of the views. Endometrium Thickness: 13.5 mm. No focal abnormality visualized. Trace fluid in the cervical canal. Right ovary Measurements: 4 x 3.1 x 2.6 cm. Normal appearance/no adnexal mass. Follicles are present. Left ovary Measurements: 4.2 x 2 x 1.7 cm. Normal appearance/no adnexal mass. Pulsed Doppler evaluation of both ovaries demonstrates normal low-resistance arterial and venous waveforms. Other findings No abnormal free fluid. IMPRESSION: No sonographic findings for the patient's pain. Electronically Signed   By: Tollie Eth M.D.   On: 07/16/2016 23:52   US Pelvis Complete  Result Date: 07/16/2016 CLINICAL DATA:  Right lower quadrant pain.  Positive for UTI. EXAM: TRANSABDOMINAL AND TRANSVAGINAL ULTRASOUND OF PELVIS DOPPLER ULTRASOUND OF OVARIES TECHNIQUE: Both transabdominal and transvaginal ultrasound examinations of the pelvis were performed. Transabdominal technique was performed for global imaging of the pelvis including uterus, ovaries, adnexal regions, and pelvic cul-de-sac. It was necessary to proceed with endovaginal exam following the transabdominal exam to visualize the ovaries. Color and duplex  Doppler ultrasound was utilized to evaluate blood flow to the ovaries. COMPARISON:  None. FINDINGS: Uterus Measurements: 8.3 x 3.7 x 4.5. No fibroids or other mass visualized. The uterus is anteverted and retroflexed on some of the views. Endometrium Thickness: 13.5 mm. No focal abnormality visualized. Trace fluid in the cervical canal. Right ovary Measurements: 4 x 3.1 x 2.6 cm. Normal appearance/no adnexal mass. Follicles are present. Left ovary Measurements: 4.2 x 2 x 1.7 cm. Normal appearance/no adnexal mass. Pulsed Doppler evaluation of both ovaries demonstrates normal low-resistance arterial and venous waveforms. Other findings No abnormal free fluid. IMPRESSION: No sonographic findings for the patient's pain. Electronically Signed   By: Tollie Eth M.D.   On: 07/16/2016 23:52   [place] Art/ven Flow Abd Pelv Doppler  Result Date: 07/16/2016 CLINICAL DATA:  Right lower quadrant pain.  Positive for UTI. EXAM: TRANSABDOMINAL AND TRANSVAGINAL ULTRASOUND OF PELVIS DOPPLER ULTRASOUND OF OVARIES TECHNIQUE: Both transabdominal and transvaginal ultrasound examinations of the pelvis were performed. Transabdominal technique was performed for global imaging of the pelvis including uterus, ovaries, adnexal regions, and pelvic cul-de-sac. It was necessary to proceed with endovaginal exam following the transabdominal exam to visualize the ovaries. Color and duplex Doppler ultrasound was utilized to evaluate blood flow to the ovaries. COMPARISON:  None. FINDINGS: Uterus Measurements: 8.3 x 3.7 x 4.5. No fibroids or other mass visualized. The uterus is anteverted and retroflexed on some of the views. Endometrium Thickness: 13.5 mm. No focal abnormality visualized. Trace fluid in the cervical canal. Right ovary Measurements: 4 x 3.1 x 2.6 cm. Normal appearance/no adnexal mass. Follicles are present. Left ovary Measurements: 4.2 x 2 x 1.7 cm. Normal appearance/no adnexal mass. Pulsed Doppler evaluation of both ovaries  demonstrates normal low-resistance arterial and venous waveforms. Other findings No abnormal free fluid. IMPRESSION: No sonographic findings for the patient's pain. Electronically Signed   By: Tollie Eth M.D.   On: 07/16/2016 23:52   Ct Renal Stone Study  Result Date: 07/16/2016 CLINICAL DATA:  Acute onset of seizures. Whole body convulsions. Initial encounter. EXAM: CT ABDOMEN AND PELVIS WITHOUT CONTRAST TECHNIQUE: Multidetector CT imaging of the abdomen and pelvis was performed following the standard protocol without IV contrast. COMPARISON:  CT of the abdomen and pelvis from 04/06/2016 FINDINGS: Lower chest: The visualized lung bases are grossly clear. The visualized portions of the mediastinum are unremarkable. Hepatobiliary: The liver is unremarkable in appearance. The gallbladder is unremarkable in appearance. The common bile duct remains normal in caliber. Pancreas: The pancreas is  within normal limits. Spleen: The spleen is unremarkable in appearance. Adrenals/Urinary Tract: The adrenal glands are unremarkable in appearance. Tiny hyperdense foci at the right kidney are nonspecific, without definite stones. There is no evidence of hydronephrosis. No obstructing ureteral stones are identified. No perinephric stranding is seen. Stomach/Bowel: The stomach is unremarkable in appearance. The small bowel is within normal limits. The appendix is normal in caliber, without evidence of appendicitis. The colon is unremarkable in appearance. Vascular/Lymphatic: The abdominal aorta is unremarkable in appearance. The inferior vena cava is grossly unremarkable. No retroperitoneal lymphadenopathy is seen. No pelvic sidewall lymphadenopathy is identified. Reproductive: The bladder is mildly distended and within normal limits. The uterus is grossly unremarkable in appearance. The ovaries are relatively symmetric. No suspicious adnexal masses are seen. Other: No additional soft tissue abnormalities are seen.  Musculoskeletal: No acute osseous abnormalities are identified. The visualized musculature is unremarkable in appearance. IMPRESSION: Unremarkable noncontrast CT of the abdomen and pelvis. Electronically Signed   By: Roanna Raider M.D.   On: 07/16/2016 20:49    Procedures Procedures (including critical care time)  Medications Ordered in ED Medications  ketorolac (TORADOL) 15 MG/ML injection 15 mg (15 mg Intravenous Given 07/16/16 1937)  levETIRAcetam (KEPPRA) tablet 1,000 mg (1,000 mg Oral Given 07/17/16 0049)  cephALEXin (KEFLEX) capsule 500 mg (500 mg Oral Given 07/17/16 0116)     Initial Impression / Assessment and Plan / ED Course  I have reviewed the triage vital signs and the nursing notes.  Pertinent labs & imaging results that were available during my care of the patient were reviewed by me and considered in my medical decision making (see chart for details).     Pt with seizures.  DDx: -Seizure disorder -Meningitis -Trauma -Electrolyte abnormality -Metabolic derangement -Stroke -Toxin induced seizures -Medication side effects  It seems like pt has underlying UTI that might have triggered the seizure, along with med non compliance.  Pt is having severe abd tenderness, pt is writing in pain. CT renal stone is neg. PElvic exam is not concerning for PID. [place] ordered and neg for torsion.  D/c. Strict ER return precautions have been discussed, and patient is agreeing with the plan and is comfortable with the workup done and the recommendations from the ER.     Final Clinical Impressions(s) / ED Diagnoses   Final diagnoses:  RLQ abdominal pain  Seizure [facility])    New Prescriptions Discharge Medication List as of 07/17/2016  1:03 AM    START taking these medications   Details  levETIRAcetam (KEPPRA) 750 MG tablet Take 1 tablet (750 mg total) by mouth 2 (two) times daily., Starting Sat 07/17/2016, Print         Derwood Kaplan, MD 07/18/16 1705

## 2016-07-19 LAB — GC/CHLAMYDIA PROBE AMP (~~LOC~~) NOT AT ARMC
CHLAMYDIA, DNA PROBE: NEGATIVE
NEISSERIA GONORRHEA: NEGATIVE

## 2016-12-08 ENCOUNTER — Encounter (HOSPITAL_COMMUNITY): Payer: Self-pay | Admitting: Nurse Practitioner

## 2016-12-08 ENCOUNTER — Ambulatory Visit (HOSPITAL_COMMUNITY)
Admission: EM | Admit: 2016-12-08 | Discharge: 2016-12-08 | Disposition: A | Discharge status: Home / Self Care | Payer: Self-pay | Attending: Family Medicine | Admitting: Family Medicine

## 2016-12-08 DIAGNOSIS — L309 Dermatitis, unspecified: Secondary | ICD-10-CM

## 2016-12-08 MED ORDER — TRIAMCINOLONE ACETONIDE 0.1 % EX CREA: 1.0000 "application " | TOPICAL_CREAM | Freq: Two times a day (BID) | CUTANEOUS | 0 refills | Status: DC

## 2016-12-08 NOTE — ED Triage Notes (Signed)
Pt presents with c/o rash. The rash began about three weeks ago. The rash is red, circular, and bumpy. The rash is itchy. She applied dexamethasone cream with some improvement. She has a history of eczema but this rash seems different.

## 2016-12-08 NOTE — ED Provider Notes (Signed)
  [facility] CARE CENTER   [id number] 12/08/16 Arrival Time: 1106  ASSESSMENT & PLAN:  1. Dermatitis   -improving  Meds ordered this encounter  Medications  . triamcinolone cream (KENALOG) 0.1 %    Sig: Apply 1 application topically 2 (two) times daily.    Dispense:  30 g    Refill:  0   Steroid cream as needed. Will f/u if not continuing to improve.  Reviewed expectations re: course of current medical issues. Questions answered. Patient verbalized understanding. After Visit Summary given.   SUBJECTIVE:  Sharon [name] is a 21 y.o. female who reports a rash on her R abdomen first noticed approx 3 weeks ago. H/O eczema but this rash a little different. Much itching at first. Overall has improved significantly with application of steroid cream. Mild itching now. Afebrile. No known new exposures. Requests refill for Triamcinolone cream she uses for eczema exacerbations.  ROS: As per HPI.   OBJECTIVE:  Vitals:   12/08/16 1145  BP: 109/71  Pulse: 70  Resp: 16  Temp: 98.7 F (37.1 C)  TempSrc: Oral  SpO2: 98%     General appearance: alert; no distress Extremities: no cyanosis or edema; symmetrical with no gross deformities Skin: R abdomen with 3 cm irregularly shaped area of erythema; slightly raised from skin; fine texture; no sign of infection; nontender Psychological: alert and cooperative; normal mood and affect  Patient has a past medical history of Anxiety; Depression; Seizure-like activity (HCC); Seizures (HCC); and Sleep apnea.   Allergies  Allergen Reactions  . Latex Hives  . Nickel Rash  . Tape Rash        Mardella Layman, MD 12/08/16 1253

## 2017-03-26 ENCOUNTER — Encounter (HOSPITAL_COMMUNITY): Payer: Self-pay | Admitting: Emergency Medicine

## 2017-03-26 ENCOUNTER — Emergency Department (HOSPITAL_COMMUNITY)
Admission: EM | Admit: 2017-03-26 | Discharge: 2017-03-26 | Disposition: A | Discharge status: Home / Self Care | Payer: Self-pay | Attending: Emergency Medicine | Admitting: Emergency Medicine

## 2017-03-26 DIAGNOSIS — Z79899 Other long term (current) drug therapy: Secondary | ICD-10-CM | POA: Insufficient documentation

## 2017-03-26 DIAGNOSIS — K029 Dental caries, unspecified: Secondary | ICD-10-CM | POA: Insufficient documentation

## 2017-03-26 DIAGNOSIS — F329 Major depressive disorder, single episode, unspecified: Secondary | ICD-10-CM | POA: Insufficient documentation

## 2017-03-26 DIAGNOSIS — K047 Periapical abscess without sinus: Secondary | ICD-10-CM | POA: Insufficient documentation

## 2017-03-26 DIAGNOSIS — Z91040 Latex allergy status: Secondary | ICD-10-CM | POA: Insufficient documentation

## 2017-03-26 DIAGNOSIS — F419 Anxiety disorder, unspecified: Secondary | ICD-10-CM | POA: Insufficient documentation

## 2017-03-26 LAB — COMPREHENSIVE METABOLIC PANEL
ALBUMIN: 4.2 g/dL (ref 3.5–5.0)
ALK PHOS: 73 U/L (ref 38–126)
ALT: 16 U/L (ref 14–54)
AST: 22 U/L (ref 15–41)
Anion gap: 11 (ref 5–15)
BUN: 6 mg/dL (ref 6–20)
CO2: 23 mmol/L (ref 22–32)
CREATININE: 0.72 mg/dL (ref 0.44–1.00)
Calcium: 9.4 mg/dL (ref 8.9–10.3)
Chloride: 103 mmol/L (ref 101–111)
GFR calc Af Amer: >60 mL/min (ref >60)
GFR calc non Af Amer: >60 mL/min (ref >60)
GLUCOSE: 116 mg/dL — AB (ref 65–99)
Potassium: 3.8 mmol/L (ref 3.5–5.1)
SODIUM: 137 mmol/L (ref 135–145)
Total Bilirubin: 0.4 mg/dL (ref 0.3–1.2)
Total Protein: 7.7 g/dL (ref 6.5–8.1)

## 2017-03-26 LAB — URINALYSIS, ROUTINE W REFLEX MICROSCOPIC
Bilirubin Urine: NEGATIVE
Glucose, UA: NEGATIVE mg/dL
Ketones, ur: 20 mg/dL — AB
Leukocytes, UA: NEGATIVE
Nitrite: NEGATIVE
PH: 5 (ref 5.0–8.0)
PROTEIN: NEGATIVE mg/dL
Specific Gravity, Urine: 1.032 — ABNORMAL HIGH (ref 1.005–1.030)

## 2017-03-26 LAB — CBC
HCT: 37.7 % (ref 36.0–46.0)
Hemoglobin: 11.9 g/dL — ABNORMAL LOW (ref 12.0–15.0)
MCH: 25.7 pg — AB (ref 26.0–34.0)
MCHC: 31.6 g/dL (ref 30.0–36.0)
MCV: 81.4 fL (ref 78.0–100.0)
PLATELETS: 272 10*3/uL (ref 150–400)
RBC: 4.63 MIL/uL (ref 3.87–5.11)
RDW: 13.6 % (ref 11.5–15.5)
WBC: 11.8 10*3/uL — ABNORMAL HIGH (ref 4.0–10.5)

## 2017-03-26 LAB — I-STAT BETA HCG BLOOD, ED (MC, WL, AP ONLY): I-stat hCG, quantitative: <5 m[IU]/mL (ref <5)

## 2017-03-26 LAB — LIPASE, BLOOD: Lipase: 16 U/L (ref 11–51)

## 2017-03-26 MED ORDER — AMOXICILLIN-POT CLAVULANATE 875-125 MG PO TABS: 1.0000 | ORAL_TABLET | Freq: Two times a day (BID) | ORAL | 0 refills | Status: AC

## 2017-03-26 MED ORDER — ONDANSETRON 4 MG PO TBDP
4.0000 mg | ORAL_TABLET | Freq: Once | ORAL | Status: AC | PRN
  Administered 2017-03-26: 4 mg via ORAL
  Filled 2017-03-26: qty 1

## 2017-03-26 MED ORDER — AMOXICILLIN-POT CLAVULANATE 875-125 MG PO TABS
1.0000 | ORAL_TABLET | Freq: Once | ORAL | Status: AC
  Administered 2017-03-26: 1 via ORAL
  Filled 2017-03-26: qty 1

## 2017-03-26 MED ORDER — MORPHINE SULFATE (PF) 4 MG/ML IV SOLN
4.0000 mg | Freq: Once | INTRAVENOUS | Status: AC
  Administered 2017-03-26: 4 mg via INTRAVENOUS
  Filled 2017-03-26: qty 1

## 2017-03-26 MED ORDER — HYDROCODONE-ACETAMINOPHEN 5-325 MG PO TABS: 2.0000 | ORAL_TABLET | Freq: Four times a day (QID) | ORAL | 0 refills | Status: AC | PRN

## 2017-03-26 MED ORDER — SODIUM CHLORIDE 0.9 % IV BOLUS (SEPSIS)
1000.0000 mL | Freq: Once | INTRAVENOUS | Status: AC
  Administered 2017-03-26: 1000 mL via INTRAVENOUS

## 2017-03-26 MED ORDER — KETOROLAC TROMETHAMINE 30 MG/ML IJ SOLN
30.0000 mg | Freq: Once | INTRAMUSCULAR | Status: AC
  Administered 2017-03-26: 30 mg via INTRAVENOUS
  Filled 2017-03-26: qty 1

## 2017-03-26 NOTE — Discharge Instructions (Signed)
Thank you for allowing us to provide your care.   Please CALL Dr. Leanord AsalFarless at [phone] as soon as possible.

## 2017-03-26 NOTE — ED Triage Notes (Signed)
Pt states new onset nausea vomiting starting this morning. Pt actively vomiting in triage. Denies any pain to abdomen or chest. Pt does state she also has a tooth ache and a headache.

## 2017-03-26 NOTE — ED Provider Notes (Signed)
[facility]   CSN: [id number] Arrival date & time: 03/26/17  0704  History   Chief Complaint Chief Complaint  Patient presents with  . Emesis   HPI Sharon [name] is a previosuly healthy  21 y.o. female who presented to the ED with 2 months of right sided jaw pain that radiates to the maxillary sinus and head. She describes the pain as sharp and stabbing. She states that she does not have dental insurance so she has not been able to see a dentist. She has had significant discomfort and pain with PO intake and as a result has limited her PO intake. She takes ibuprofen and tylenol with some relief but the pain quickly returns. She is also having some headaches, N/V, and diarrhea. All of her associated symptoms began acutely over the last 2-3 days. She is otherwise healthy. Denies trismus, dysphagia, odynophagia, fevers, chills, visual changes.   Past Medical History:  Diagnosis Date  . Anxiety   . Depression   . Seizure-like activity (HCC)   . Seizures (HCC)   . Sleep apnea    as a child and grew out of it   Patient Active Problem List   Diagnosis Date Noted  . Severe recurrent major depressive disorder with psychotic features (HCC)   . PTSD (post-traumatic stress disorder) 06/17/2014  . MDD (major depressive disorder), recurrent episode, severe (HCC) 06/12/2014  . Depression 02/28/2014  . Anxiety 02/28/2014  . Seizures (HCC) 01/10/2014   Past Surgical History:  Procedure Laterality Date  . NO PAST SURGERIES    . None     OB History    No data available     Home Medications    Prior to Admission medications   Medication Sig Start Date End Date Taking? Authorizing Provider  amoxicillin-clavulanate (AUGMENTIN) 875-125 MG tablet Take 1 tablet by mouth every 12 (twelve) hours for 7 days. 03/26/17 04/02/17  Levora DredgeHelberg, Alon Mazor, MD  ASHLYNA 0.15-0.03 &0.01 MG tablet Take 1 tablet by mouth daily. 01/06/16   [provider]   ciprofloxacin (CIPRO) 500 MG tablet Take 1 tablet (500 mg total) by mouth 2 (two) times daily. Patient not taking: Reported on 07/11/2016 04/06/16   Emi HolesLaw, Alexandra M, PA-C  fluticasone [facility](FLONASE) 50 MCG/ACT nasal spray Place 2 sprays into both nostrils daily. 07/11/16   Palumbo, April, MD  HYDROcodone-acetaminophen (NORCO/VICODIN) 5-325 MG tablet Take 2 tablets by mouth every 6 (six) hours as needed for up to 3 days for moderate pain or severe pain. 03/26/17 03/29/17  Levora DredgeHelberg, Ronn Smolinsky, MD  ibuprofen (ADVIL,MOTRIN) 800 MG tablet Take 1 tablet (800 mg total) by mouth 3 (three) times daily. Patient not taking: Reported on 07/11/2016 04/06/16   Emi HolesLaw, Alexandra M, PA-C  levETIRAcetam (KEPPRA) 750 MG tablet Take 1 tablet (750 mg total) by mouth 2 (two) times daily. 07/17/16   Derwood KaplanNanavati, Ankit, MD  nitrofurantoin, macrocrystal-monohydrate, (MACROBID) 100 MG capsule Take 1 capsule (100 mg total) by mouth 2 (two) times daily. X 7 days 07/11/16   Palumbo, April, MD  ondansetron (ZOFRAN) 4 MG tablet Take 1 tablet (4 mg total) by mouth every 6 (six) hours. Patient not taking: Reported on 07/11/2016 04/06/16   Emi HolesLaw, Alexandra M, PA-C  triamcinolone cream (KENALOG) 0.1 % Apply 1 application topically 2 (two) times daily. 12/08/16   Mardella LaymanHagler, Brian, MD   Family History Family History  Adopted: Yes   Social History Social History   Tobacco Use  . Smoking status: Never Smoker  .  Smokeless tobacco: Never Used  Substance Use Topics  . Alcohol use: No  . Drug use: No   Allergies   Latex; Nickel; and Tape  Review of Systems  All systems reviewed and are negative for acute change except as noted in the HPI.  Physical Exam Updated Vital Signs BP 110/73   Pulse 76   Temp 98.9 F (37.2 C) (Oral)   Resp 16   LMP 03/26/2017   SpO2 100%   General: Thin female holding her jaw HENT: Right TM is clear with good light reflex, left ear canal has a cerumen impaction. There is a visible dental carrie on the medial side  of her upper posterior molar with associated erythema and swelling. Oropharynx is clear without exudates.  No anterior or posterior auricular LAD is noted. No significant facial swelling. No cervical or supraclavicular LAD noted.  Pulm: Good air movement with no wheezing or crackles  CV: RRR, no murmurs, no rubs  Abdomen: Active bowel sounds, soft, non-distended, no tenderness to palpation  Extremities: No LE edema, pulses palpable  Skin: No new rashes or lesions, warm and dry  Neuro: Alert and oriented x 3. Cranial nerves intact bilaterally   ED Treatments / Results  Labs (all labs ordered are listed, but only abnormal results are displayed) Labs Reviewed  COMPREHENSIVE METABOLIC PANEL - Abnormal; Notable for the following components:      Result Value   Glucose, Bld 116 (*)    All other components within normal limits  CBC - Abnormal; Notable for the following components:   WBC 11.8 (*)    Hemoglobin 11.9 (*)    MCH 25.7 (*)    All other components within normal limits  URINALYSIS, ROUTINE W REFLEX MICROSCOPIC - Abnormal; Notable for the following components:   APPearance HAZY (*)    Specific Gravity, Urine 1.032 (*)    Hgb urine dipstick LARGE (*)    Ketones, ur 20 (*)    Bacteria, UA RARE (*)    Squamous Epithelial / LPF 6-30 (*)    All other components within normal limits  LIPASE, BLOOD  I-STAT BETA HCG BLOOD, ED ([facility], [facility], [facility] ONLY)   EKG  EKG Interpretation None      Radiology No results found.  Procedures Procedures (including critical care time)  Medications Ordered in ED Medications  ondansetron (ZOFRAN-ODT) disintegrating tablet 4 mg (4 mg Oral Given 03/26/17 0746)  sodium chloride 0.9 % bolus 1,000 mL (0 mLs Intravenous Stopped 03/26/17 0939)  ketorolac (TORADOL) 30 MG/ML injection 30 mg (30 mg Intravenous Given 03/26/17 0840)  amoxicillin-clavulanate (AUGMENTIN) 875-125 MG per tablet 1 tablet (1 tablet Oral Given 03/26/17 0840)  morphine 4 MG/ML injection 4 mg  (4 mg Intravenous Given 03/26/17 0937)   Initial Impression / Assessment and Plan / ED Course  I have reviewed the triage vital signs and the nursing notes.  Pertinent labs & imaging results that were available during my care of the patient were reviewed by me and considered in my medical decision making (see chart for details).    Patient presented with signs and symptoms of of a dental carrie with possible underlying infection. She lacks signs and symptoms of a deep soft tissue infection. She was treated acutely with IV fluids, Zofran, Toradol, Morphine, and Augmentin. Her pain significantly improved. She was given the information for the on-call dentist (Dr. Leanord Asal, [phone]) and instructed to call/follow-up. She was given a prescription for Augmentin. The plan was discussed with the patient and  standard return precautions were given. She voiced understanding and agreed with the plan. She was discharged in stable condition.   Final Clinical Impressions(s) / ED Diagnoses   Final diagnoses:  Infected dental carries   ED Discharge Orders        Ordered    amoxicillin-clavulanate (AUGMENTIN) 875-125 MG tablet  Every 12 hours     03/26/17 0851    HYDROcodone-acetaminophen (NORCO/VICODIN) 5-325 MG tablet  Every 6 hours PRN     03/26/17 0851       Levora DredgeHelberg, Yesha Muchow, MD 03/26/17 0955    Charlynne PanderYao, David Hsienta, MD 03/27/17 [phone]

## 2017-03-30 ENCOUNTER — Emergency Department (HOSPITAL_COMMUNITY): Payer: No Typology Code available for payment source

## 2017-03-30 ENCOUNTER — Emergency Department (HOSPITAL_COMMUNITY)
Admission: EM | Admit: 2017-03-30 | Discharge: 2017-03-30 | Disposition: A | Discharge status: Home / Self Care | Payer: No Typology Code available for payment source | Attending: Emergency Medicine | Admitting: Emergency Medicine

## 2017-03-30 ENCOUNTER — Encounter (HOSPITAL_COMMUNITY): Payer: Self-pay

## 2017-03-30 DIAGNOSIS — Y939 Activity, unspecified: Secondary | ICD-10-CM | POA: Diagnosis not present

## 2017-03-30 DIAGNOSIS — Y929 Unspecified place or not applicable: Secondary | ICD-10-CM | POA: Insufficient documentation

## 2017-03-30 DIAGNOSIS — Y999 Unspecified external cause status: Secondary | ICD-10-CM | POA: Diagnosis not present

## 2017-03-30 DIAGNOSIS — R569 Unspecified convulsions: Secondary | ICD-10-CM | POA: Insufficient documentation

## 2017-03-30 DIAGNOSIS — Z79899 Other long term (current) drug therapy: Secondary | ICD-10-CM | POA: Insufficient documentation

## 2017-03-30 LAB — URINALYSIS, ROUTINE W REFLEX MICROSCOPIC
BILIRUBIN URINE: NEGATIVE
Glucose, UA: NEGATIVE mg/dL
KETONES UR: NEGATIVE mg/dL
LEUKOCYTES UA: NEGATIVE
Nitrite: NEGATIVE
PROTEIN: NEGATIVE mg/dL
SPECIFIC GRAVITY, URINE: 1.008 (ref 1.005–1.030)
pH: 8 (ref 5.0–8.0)

## 2017-03-30 LAB — I-STAT BETA HCG BLOOD, ED (MC, WL, AP ONLY)

## 2017-03-30 MED ORDER — CYCLOBENZAPRINE HCL 10 MG PO TABS: 10.0000 mg | ORAL_TABLET | Freq: Two times a day (BID) | ORAL | 0 refills | Status: DC | PRN

## 2017-03-30 MED ORDER — HYDROCODONE-ACETAMINOPHEN 5-325 MG PO TABS
1.0000 | ORAL_TABLET | Freq: Once | ORAL | Status: AC
  Administered 2017-03-30: 1 via ORAL
  Filled 2017-03-30: qty 1

## 2017-03-30 MED ORDER — KETOROLAC TROMETHAMINE 60 MG/2ML IM SOLN
60.0000 mg | Freq: Once | INTRAMUSCULAR | Status: AC
  Administered 2017-03-30: 60 mg via INTRAMUSCULAR
  Filled 2017-03-30: qty 2

## 2017-03-30 NOTE — ED Notes (Signed)
Patient transported to CT 
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
## 2017-03-30 NOTE — ED Notes (Signed)
Pt getting dressed.

## 2017-03-30 NOTE — ED Notes (Signed)
Pt taking po fluids and tolerating well. 
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
## 2017-04-01 MED ORDER — CYCLOBENZAPRINE HCL 10 MG PO TABS: 10.0000 mg | ORAL_TABLET | Freq: Two times a day (BID) | ORAL | 0 refills | Status: DC | PRN

## 2018-07-08 ENCOUNTER — Ambulatory Visit (HOSPITAL_COMMUNITY)
Admission: EM | Admit: 2018-07-08 | Discharge: 2018-07-08 | Disposition: A | Discharge status: Home / Self Care | Payer: Self-pay | Attending: Family Medicine | Admitting: Family Medicine

## 2018-07-08 ENCOUNTER — Other Ambulatory Visit: Payer: Self-pay

## 2018-07-08 ENCOUNTER — Encounter (HOSPITAL_COMMUNITY): Payer: Self-pay | Admitting: *Deleted

## 2018-07-08 DIAGNOSIS — R69 Illness, unspecified: Secondary | ICD-10-CM

## 2018-07-08 DIAGNOSIS — J111 Influenza due to unidentified influenza virus with other respiratory manifestations: Secondary | ICD-10-CM

## 2018-07-08 DIAGNOSIS — N760 Acute vaginitis: Secondary | ICD-10-CM

## 2018-07-08 DIAGNOSIS — B9689 Other specified bacterial agents as the cause of diseases classified elsewhere: Secondary | ICD-10-CM

## 2018-07-08 MED ORDER — METRONIDAZOLE 0.75 % VA GEL: 1.0000 | Freq: Two times a day (BID) | VAGINAL | 0 refills | Status: DC

## 2018-07-08 MED ORDER — HYDROCODONE-HOMATROPINE 5-1.5 MG/5ML PO SYRP: 5.0000 mL | ORAL_SOLUTION | Freq: Four times a day (QID) | ORAL | 0 refills | Status: DC | PRN

## 2018-07-08 NOTE — ED Provider Notes (Signed)
[facility]   [id number] 07/08/18 Arrival Time: 1119  ASSESSMENT & PLAN:  1. Influenza-like illness   2. BV (bacterial vaginosis)    No indication for chest imaging at this time. Discussed. Also reports recurrent BV. Requests refill of MetroGel. No current symptoms.  See AVS for discharge instructions.  Meds ordered this encounter  Medications  . metroNIDAZOLE (METROGEL VAGINAL) 0.75 % vaginal gel    Sig: Place 1 Applicatorful vaginally 2 (two) times daily.    Dispense:  70 g    Refill:  0  . HYDROcodone-homatropine (HYCODAN) 5-1.5 MG/5ML syrup    Sig: Take 5 mLs by mouth every 6 (six) hours as needed for cough.    Dispense:  90 mL    Refill:  0   Cough medication sedation precautions. Discussed typical duration of symptoms. OTC symptom care as needed. Ensure adequate fluid intake and rest. May f/u with PCP or here as needed.  Reviewed expectations re: course of current medical issues. Questions answered. Outlined signs and symptoms indicating need for more acute intervention. Patient verbalized understanding. After Visit Summary given.   SUBJECTIVE: History from: patient.  Sharon [name] is a 23 y.o. female who presents with complaint of nasal congestion, post-nasal drainage, and a persistent dry cough; without sore throat. Onset abrupt, 4-5 days ago; with fatigue and with body aches. SOB: none. Wheezing: none. Fever: subjective with chills. Overall normal PO intake without n/v. Known sick contacts: no. No specific or significant aggravating or alleviating factors reported. OTC treatment: none reported.  Social History   Tobacco Use  Smoking Status Never Smoker  Smokeless Tobacco Never Used   ROS: As per HPI. All other systems negative.   OBJECTIVE:  Vitals:   07/08/18 1159  BP: (!) 111/59  Pulse: 75  Resp: (!) 22  Temp: 98.4 F (36.9 C)  TempSrc: Oral  SpO2: 100%    Recheck RR: 18  General appearance: alert; appears fatigued HEENT:  nasal congestion; clear runny nose; throat irritation secondary to post-nasal drainage Neck: supple without LAD CV: RRR Lungs: unlabored respirations, symmetrical air entry without wheezing; cough: moderate Abd: soft Ext: no LE edema Skin: warm and dry Psychological: alert and cooperative; normal mood and affect   Allergies  Allergen Reactions  . Latex Hives  . Nickel Rash  . Tape Rash    Past Medical History:  Diagnosis Date  . Anxiety   . Depression   . Seizure-like activity (HCC)   . Seizures (HCC)   . Sleep apnea    as a child and grew out of it   Family History  Adopted: Yes  Problem Relation Age of Onset  . Healthy Mother   . Healthy Father    Social History   Socioeconomic History  . Marital status: Single    Spouse name: Not on file  . Number of children: 0  . Years of education: college  . Highest education level: Not on file  Occupational History  . Occupation: STUDENT    Employer: [employer]  Social Needs  . Financial resource strain: Not on file  . Food insecurity:    Worry: Not on file    Inability: Not on file  . Transportation needs:    Medical: Not on file    Non-medical: Not on file  Tobacco Use  . Smoking status: Never Smoker  . Smokeless tobacco: Never Used  Substance and Sexual Activity  . Alcohol use: No  . Drug use: No  .  Sexual activity: Not Currently  Lifestyle  . Physical activity:    Days per week: Not on file    Minutes per session: Not on file  . Stress: Not on file  Relationships  . Social connections:    Talks on phone: Not on file    Gets together: Not on file    Attends religious service: Not on file    Active member of club or organization: Not on file    Attends meetings of clubs or organizations: Not on file    Relationship status: Not on file  . Intimate partner violence:    Fear of current or ex partner: Not on file    Emotionally abused: Not on file    Physically abused: Not on file    Forced  sexual activity: Not on file  Other Topics Concern  . Not on file  Social History Narrative   Patient is going to college full time . [employer].    Patient is working full time.   Right handed.   Caffeine None           Mardella Layman, MD 07/11/18 [phone]

## 2018-07-08 NOTE — ED Triage Notes (Signed)
C/O cough, nasal congestion, body aches, and "sweats" x 4-5 days.  Unk if fevers.

## 2018-07-08 NOTE — Discharge Instructions (Addendum)

## 2018-07-08 NOTE — ED Notes (Signed)
Bed: UC08 Expected date: 07/08/18 Expected time: 12:25 PM Means of arrival:  Comments: Held until 12:25

## 2018-07-12 ENCOUNTER — Telehealth (HOSPITAL_COMMUNITY): Payer: Self-pay | Admitting: Emergency Medicine

## 2018-07-12 NOTE — Telephone Encounter (Signed)
Pt called and sts still having cough; per [facility] continue OTC treatments and cough could last several weeks; if worsening fever or SOB be reevaluated; pt verbalized understanding

## 2018-09-13 ENCOUNTER — Encounter (HOSPITAL_COMMUNITY): Payer: Self-pay

## 2018-09-13 ENCOUNTER — Ambulatory Visit (HOSPITAL_COMMUNITY)
Admission: EM | Admit: 2018-09-13 | Discharge: 2018-09-13 | Disposition: A | Discharge status: Home / Self Care | Payer: Self-pay | Attending: Family Medicine | Admitting: Family Medicine

## 2018-09-13 ENCOUNTER — Other Ambulatory Visit: Payer: Self-pay

## 2018-09-13 DIAGNOSIS — K0889 Other specified disorders of teeth and supporting structures: Secondary | ICD-10-CM | POA: Insufficient documentation

## 2018-09-13 DIAGNOSIS — R300 Dysuria: Secondary | ICD-10-CM | POA: Insufficient documentation

## 2018-09-13 DIAGNOSIS — B9689 Other specified bacterial agents as the cause of diseases classified elsewhere: Secondary | ICD-10-CM | POA: Insufficient documentation

## 2018-09-13 DIAGNOSIS — N3090 Cystitis, unspecified without hematuria: Secondary | ICD-10-CM | POA: Insufficient documentation

## 2018-09-13 DIAGNOSIS — N760 Acute vaginitis: Secondary | ICD-10-CM | POA: Insufficient documentation

## 2018-09-13 LAB — POCT URINALYSIS DIP (DEVICE)
Bilirubin Urine: NEGATIVE
Glucose, UA: NEGATIVE mg/dL
Hgb urine dipstick: NEGATIVE
Ketones, ur: NEGATIVE mg/dL
Leukocytes,Ua: NEGATIVE
Nitrite: POSITIVE — AB
Protein, ur: 100 mg/dL — AB
Specific Gravity, Urine: 1.025 (ref 1.005–1.030)
Urobilinogen, UA: 0.2 mg/dL (ref 0.0–1.0)
pH: 7.5 (ref 5.0–8.0)

## 2018-09-13 MED ORDER — METRONIDAZOLE 0.75 % VA GEL: 1.0000 | Freq: Two times a day (BID) | VAGINAL | 0 refills | Status: DC

## 2018-09-13 MED ORDER — CEPHALEXIN 500 MG PO CAPS: 500.0000 mg | ORAL_CAPSULE | Freq: Two times a day (BID) | ORAL | 0 refills | Status: DC

## 2018-09-13 MED ORDER — IBUPROFEN 800 MG PO TABS: 800.0000 mg | ORAL_TABLET | Freq: Three times a day (TID) | ORAL | 0 refills | Status: DC

## 2018-09-13 MED ORDER — IBUPROFEN 800 MG PO TABS
800.0000 mg | ORAL_TABLET | Freq: Three times a day (TID) | ORAL | 0 refills | Status: AC
Start: 2018-09-13 — End: ?

## 2018-09-13 NOTE — ED Triage Notes (Signed)
Patient presents to Urgent Care with complaints of left upper dental pain, need for UTI testing, and "metronidazole for my chronic BV".

## 2018-09-15 ENCOUNTER — Telehealth (HOSPITAL_COMMUNITY): Payer: Self-pay | Admitting: Emergency Medicine

## 2018-09-15 LAB — URINE CULTURE: Culture: >=100000 — AB

## 2018-09-15 NOTE — Telephone Encounter (Signed)
Urine culture was positive for KLEBSIELLA PNEUMONIAE and was given keflex  at urgent care visit. Attempted to reach patient. No answer at this time.  

## 2018-09-19 NOTE — ED Provider Notes (Signed)
[facility]   [id number] 09/13/18 Arrival Time: 1821  ASSESSMENT & PLAN:  1. Pain, dental   2. Dysuria   3. BV (bacterial vaginosis)   4. Cystitis    No sign of abscess requiring I&D at this time. Discussed.  Meds ordered this encounter  Medications  . cephALEXin (KEFLEX) 500 MG capsule    Sig: Take 1 capsule (500 mg total) by mouth 2 (two) times daily.    Dispense:  20 capsule    Refill:  0  . metroNIDAZOLE (METROGEL VAGINAL) 0.75 % vaginal gel    Sig: Place 1 Applicatorful vaginally 2 (two) times daily.    Dispense:  70 g    Refill:  0  . ibuprofen (ADVIL) 800 MG tablet    Sig: Take 1 tablet (800 mg total) by mouth 3 (three) times daily.    Dispense:  21 tablet    Refill:  0   Will tx for UTI also. Urine culture sent. No signs of PID or pyelonephritis. Dental resource written instructions given. She will schedule dental evaluation as soon as possible if not improving over the next 24-48 hours.  Reviewed expectations re: course of current medical issues. Questions answered. Outlined signs and symptoms indicating need for more acute intervention. Patient verbalized understanding. After Visit Summary given.   SUBJECTIVE:  Sharon [name] is a 23 y.o. female who reports gradual onset of left upper dental pain described as aching. Present for several days. Fever: absent. Tolerating PO intake but reports pain with chewing. Normal swallowing. She does not see a dentist regularly. No neck swelling or pain. OTC analgesics without relief.  Also reports chronic BV. Desires treatment. Mild dysuria for a few days. No hematuria. Scant clear vaginal d/c consistent with prior episodes of BV.  ROS: As per HPI. All other systems negative.   OBJECTIVE: Vitals:   09/13/18 1917  BP: (!) 110/59  Pulse: 72  Resp: 18  Temp: 98.7 F (37.1 C)  TempSrc: Oral  SpO2: 100%    General appearance: alert; no distress HENT: normocephalic; atraumatic; dentition: fair; left upper  gums without areas of fluctuance, drainage, or bleeding and with tenderness to palpation; normal jaw movement without difficulty Neck: supple without LAD; FROM; trachea midline Lungs: normal respirations; unlabored Abd: soft and non-tender GU: declines Skin: warm and dry Psychological: alert and cooperative; normal mood and affect  Allergies  Allergen Reactions  . Latex Hives  . Nickel Rash  . Tape Rash    Past Medical History:  Diagnosis Date  . Anxiety   . Depression   . Seizure-like activity (HCC)   . Seizures (HCC)   . Sleep apnea    as a child and grew out of it   Social History   Socioeconomic History  . Marital status: Single    Spouse name: Not on file  . Number of children: 0  . Years of education: college  . Highest education level: Not on file  Occupational History  . Occupation: STUDENT    Employer: [employer]  Social Needs  . Financial resource strain: Not on file  . Food insecurity:    Worry: Not on file    Inability: Not on file  . Transportation needs:    Medical: Not on file    Non-medical: Not on file  Tobacco Use  . Smoking status: Never Smoker  . Smokeless tobacco: Never Used  Substance and Sexual Activity  . Alcohol use: No  . Drug use: No  .  Sexual activity: Not Currently  Lifestyle  . Physical activity:    Days per week: Not on file    Minutes per session: Not on file  . Stress: Not on file  Relationships  . Social connections:    Talks on phone: Not on file    Gets together: Not on file    Attends religious service: Not on file    Active member of club or organization: Not on file    Attends meetings of clubs or organizations: Not on file    Relationship status: Not on file  . Intimate partner violence:    Fear of current or ex partner: Not on file    Emotionally abused: Not on file    Physically abused: Not on file    Forced sexual activity: Not on file  Other Topics Concern  . Not on file  Social History Narrative    Patient is going to college full time . [employer].    Patient is working full time.   Right handed.   Caffeine None   Family History  Adopted: Yes  Problem Relation Age of Onset  . Healthy Mother   . Healthy Father    Past Surgical History:  Procedure Laterality Date  . NO PAST SURGERIES    . None       Mardella LaymanHagler, Estefana Taylor, MD 09/19/18 1105

## 2018-10-14 ENCOUNTER — Other Ambulatory Visit: Payer: Self-pay

## 2018-10-14 ENCOUNTER — Ambulatory Visit (HOSPITAL_COMMUNITY)
Admission: EM | Admit: 2018-10-14 | Discharge: 2018-10-14 | Disposition: A | Discharge status: Home / Self Care | Payer: Self-pay | Attending: Family | Admitting: Family

## 2018-10-14 DIAGNOSIS — N898 Other specified noninflammatory disorders of vagina: Secondary | ICD-10-CM

## 2018-10-14 DIAGNOSIS — N949 Unspecified condition associated with female genital organs and menstrual cycle: Secondary | ICD-10-CM

## 2018-10-14 DIAGNOSIS — Z7189 Other specified counseling: Secondary | ICD-10-CM

## 2018-10-14 MED ORDER — METRONIDAZOLE 0.75 % VA GEL: 1.0000 | Freq: Every day | VAGINAL | 2 refills | Status: AC

## 2018-10-14 MED ORDER — METRONIDAZOLE 500 MG PO TABS: 500.0000 mg | ORAL_TABLET | Freq: Two times a day (BID) | ORAL | 0 refills | Status: AC

## 2018-10-14 MED ORDER — FLUCONAZOLE 150 MG PO TABS: 150.0000 mg | ORAL_TABLET | Freq: Once | ORAL | 2 refills | Status: AC

## 2018-10-14 NOTE — Discharge Instructions (Addendum)
Recommend start Metrogel - use 1 applicatorful at night for the next 5 days and then once weekly. May use Diflucan 150mg  1 tablet- then repeat 1 tablet in 2 days and then another tablet in 5 days. [facility] to schedule appointment for further evaluation of recurrent BV.

## 2018-10-14 NOTE — ED Triage Notes (Signed)
Pt here for refill on metro gel and diflucan; pt sts chronic issues with both
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
## 2019-10-08 ENCOUNTER — Other Ambulatory Visit: Payer: Self-pay

## 2019-10-08 ENCOUNTER — Ambulatory Visit (HOSPITAL_COMMUNITY)
Admission: EM | Admit: 2019-10-08 | Discharge: 2019-10-08 | Disposition: A | Discharge status: Home / Self Care | Payer: Self-pay | Attending: Family Medicine | Admitting: Family Medicine

## 2019-10-08 ENCOUNTER — Encounter (HOSPITAL_COMMUNITY): Payer: Self-pay

## 2019-10-08 DIAGNOSIS — R109 Unspecified abdominal pain: Secondary | ICD-10-CM

## 2019-10-08 DIAGNOSIS — K047 Periapical abscess without sinus: Secondary | ICD-10-CM | POA: Insufficient documentation

## 2019-10-08 DIAGNOSIS — R1030 Lower abdominal pain, unspecified: Secondary | ICD-10-CM | POA: Insufficient documentation

## 2019-10-08 DIAGNOSIS — Z3202 Encounter for pregnancy test, result negative: Secondary | ICD-10-CM

## 2019-10-08 DIAGNOSIS — K0889 Other specified disorders of teeth and supporting structures: Secondary | ICD-10-CM

## 2019-10-08 DIAGNOSIS — R079 Chest pain, unspecified: Secondary | ICD-10-CM

## 2019-10-08 LAB — POCT URINALYSIS DIP (DEVICE)
Bilirubin Urine: NEGATIVE
Glucose, UA: NEGATIVE mg/dL
Ketones, ur: 15 mg/dL — AB
Leukocytes,Ua: NEGATIVE
Nitrite: POSITIVE — AB
Protein, ur: NEGATIVE mg/dL
Specific Gravity, Urine: >=1.03 (ref 1.005–1.030)
Urobilinogen, UA: 0.2 mg/dL (ref 0.0–1.0)
pH: 6.5 (ref 5.0–8.0)

## 2019-10-08 LAB — POC URINE PREG, ED: Preg Test, Ur: NEGATIVE

## 2019-10-08 MED ORDER — AMOXICILLIN-POT CLAVULANATE 875-125 MG PO TABS: 1.0000 | ORAL_TABLET | Freq: Two times a day (BID) | ORAL | 0 refills | Status: AC

## 2019-10-08 MED ORDER — KETOROLAC TROMETHAMINE 30 MG/ML IJ SOLN
30.0000 mg | Freq: Once | INTRAMUSCULAR | Status: AC
  Administered 2019-10-08: 19:00:00 30 mg via INTRAMUSCULAR

## 2019-10-08 MED ORDER — TRAMADOL HCL 50 MG PO TABS
50.0000 mg | ORAL_TABLET | Freq: Four times a day (QID) | ORAL | 0 refills | Status: AC | PRN
Start: 2019-10-08 — End: ?

## 2019-10-08 MED ORDER — NAPROXEN 500 MG PO TABS: 500.0000 mg | ORAL_TABLET | Freq: Two times a day (BID) | ORAL | 0 refills | Status: AC

## 2019-10-08 MED ORDER — KETOROLAC TROMETHAMINE 30 MG/ML IJ SOLN
INTRAMUSCULAR | Status: AC
  Filled 2019-10-08: qty 1

## 2019-10-08 MED ORDER — METRONIDAZOLE 500 MG PO TABS: 500.0000 mg | ORAL_TABLET | Freq: Two times a day (BID) | ORAL | 0 refills | Status: AC

## 2019-10-08 NOTE — Discharge Instructions (Signed)
Begin Augmentin and metronidazole twice daily for 1 week, take with food, these antibiotics will treat UTI, dental infection and BV  Naprosyn twice daily for cramping Tramadol for severe pain  Follow up with OBGYN for painful cramping Return here if symptoms not improving or worsening

## 2019-10-08 NOTE — ED Triage Notes (Signed)
Patient complains of menstrual cramps, chest pain, and mouth pain that started yesterday.

## 2019-10-09 LAB — CERVICOVAGINAL ANCILLARY ONLY
Bacterial Vaginitis (gardnerella): POSITIVE — AB
Candida Glabrata: NEGATIVE
Candida Vaginitis: NEGATIVE
Chlamydia: NEGATIVE
Comment: NEGATIVE
Comment: NEGATIVE
Comment: NEGATIVE
Comment: NEGATIVE
Comment: NEGATIVE
Comment: NORMAL
Neisseria Gonorrhea: NEGATIVE
Trichomonas: NEGATIVE
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
## 2019-10-10 ENCOUNTER — Telehealth (HOSPITAL_COMMUNITY): Payer: Self-pay | Admitting: Orthopedic Surgery

## 2019-10-10 LAB — URINE CULTURE

## 2019-10-10 MED ORDER — FLUCONAZOLE 150 MG PO TABS: 150.0000 mg | ORAL_TABLET | Freq: Every day | ORAL | 0 refills | Status: AC

## 2019-11-01 ENCOUNTER — Telehealth (HOSPITAL_COMMUNITY): Payer: Self-pay

## 2019-11-01 DIAGNOSIS — N898 Other specified noninflammatory disorders of vagina: Secondary | ICD-10-CM

## 2019-11-01 MED ORDER — METRONIDAZOLE 0.75 % VA GEL: 1.0000 | Freq: Every day | VAGINAL | 0 refills | Status: DC

## 2019-11-01 MED ORDER — METRONIDAZOLE 0.75 % VA GEL: 1.0000 | Freq: Every day | VAGINAL | 0 refills | Status: AC

## 2019-11-01 NOTE — Telephone Encounter (Signed)
Pt states she spoke with RN last week re changing Flagyl pills.  The pills have a weird texture and make her vomit.  She has not been able to consistently take them and would like to see if there is something else she can take for the BV.  Rx sent to pharmacy of choice for Metrogel.  Pt states she may needs months worth d/t recurring BV infections or may RTC.  Discussed need for establishing with GYN for exams and eval of recurring BV.  Pt verbalized understanding.  All questions answered.
